# Patient Record
Sex: Female | Born: 1995 | Race: White | Hispanic: No | Marital: Single | Smoking: Former smoker
Health system: Southern US, Community
[De-identification: ages and names within clinical notes are randomized; demographics above are authoritative.]

## PROBLEM LIST (undated history)

## (undated) DIAGNOSIS — O24419 Gestational diabetes mellitus in pregnancy, unspecified control: Secondary | ICD-10-CM

## (undated) DIAGNOSIS — Z789 Other specified health status: Secondary | ICD-10-CM

## (undated) HISTORY — DX: Other specified health status: Z78.9

---
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
## 2014-08-11 ENCOUNTER — Emergency Department
Admission: EM | Admit: 2014-08-11 | Discharge: 2014-08-11 | Disposition: A | Discharge status: Home / Self Care | Payer: Self-pay | Attending: Emergency Medicine | Admitting: Emergency Medicine

## 2014-08-11 DIAGNOSIS — R350 Frequency of micturition: Secondary | ICD-10-CM | POA: Insufficient documentation

## 2014-08-11 DIAGNOSIS — Z3202 Encounter for pregnancy test, result negative: Secondary | ICD-10-CM | POA: Insufficient documentation

## 2014-08-11 DIAGNOSIS — J029 Acute pharyngitis, unspecified: Secondary | ICD-10-CM | POA: Insufficient documentation

## 2014-08-11 DIAGNOSIS — Z87898 Personal history of other specified conditions: Secondary | ICD-10-CM

## 2014-08-11 LAB — URINALYSIS COMPLETE WITH MICROSCOPIC (ARMC ONLY)
Bacteria, UA: NONE SEEN
Bilirubin Urine: NEGATIVE
Glucose, UA: NEGATIVE mg/dL
Hgb urine dipstick: NEGATIVE
Ketones, ur: NEGATIVE mg/dL
Nitrite: NEGATIVE
Protein, ur: NEGATIVE mg/dL
Specific Gravity, Urine: 1.024 (ref 1.005–1.030)
pH: 5 (ref 5.0–8.0)

## 2014-08-11 LAB — POCT PREGNANCY, URINE: Preg Test, Ur: NEGATIVE

## 2014-08-11 MED ORDER — AMOXICILLIN 875 MG PO TABS: 875.0000 mg | ORAL_TABLET | Freq: Two times a day (BID) | ORAL | Status: DC

## 2014-08-11 NOTE — ED Provider Notes (Signed)
[facility] Emergency Department Provider Note  ____________________________________________  Time seen: Approximately 4:54 PM  I have reviewed the triage vital signs and the nursing notes.   HISTORY  Chief Complaint Urinary Frequency; Possible Pregnancy; and Sore Throat   HPI Valerie Crane is a 19 y.o. female presents to ER for multiple medical complaints. Patient states last few days noticing she felt like she was urinating more often and states that she would occasionally have burning with urination only. Patient also reports that she does make sure she is not pregnant. Also complaints of sore throat 2 days.  States she has been drinking more water recently. States that she does occasionally have some burning with urination. Denies vaginal pain, burning, odor, discharge. Reports one sexual partner. Reports sometimes uses condoms. Patient reports that approximately 2-3 weeks ago she had sexual intercourse and states boyfriend "pulled out "but wanted to make sure she was not pregnant. Patient denies abdominal pain. Patient reports occasional nausea in the morning, but states that she has had nausea in the morning since she was a teenager and her nausea feels the same as her normal. Denies other changes. Reports last period 3 weeks ago. Denies abnormal or missed menstrual.   Patient also reports that she had a sore throat which onset was yesterday. Patient states that sore throat sometimes hurts to swallow. Also reports that she has had some runny nose and right ear discomfort. States sore throat pain is 2 out of 10 and scratchy. Denies other pain. Denies fever. Denies changes in eating or drinking. Denies bowel changes.     History reviewed. No pertinent past medical history.  There are no active problems to display for this patient.   History reviewed. No pertinent past surgical history.  No current outpatient prescriptions on file.  Allergies Review of  patient's allergies indicates no known allergies.  No family history on file.  Social History History  Substance Use Topics  . Smoking status: Never Smoker   . Smokeless tobacco: Not on file  . Alcohol Use: No    Review of Systems Constitutional: No fever/chills Eyes: No visual changes. ENT: No sore throat. Cardiovascular: Denies chest pain. Respiratory: Denies shortness of breath. Gastrointestinal: No abdominal pain.  No nausea, no vomiting.  No diarrhea.  No constipation. Genitourinary:reports occasional urinary burning and frequency. Denies urinary urgency, difficulty urinating. Musculoskeletal: Negative for back pain. Skin: Negative for rash. Neurological: Negative for headaches, focal weakness or numbness.  10-point ROS otherwise negative.  ____________________________________________   PHYSICAL EXAM:  VITAL SIGNS: ED Triage Vitals  Enc Vitals Group     BP 08/11/14 1538 131/94 mmHg     Pulse Rate 08/11/14 1538 96     Resp 08/11/14 1538 16     Temp 08/11/14 1538 98.4 F (36.9 C)     Temp src --      SpO2 08/11/14 1538 100 %     Weight 08/11/14 1538 210 lb (95.255 kg)     Height 08/11/14 1538 5\' 8"  (1.727 m)     Head Cir --      Peak Flow --      Pain Score --      Pain Loc --      Pain Edu? --      Excl. in GC? --     Constitutional: Alert and oriented. Well appearing and in no acute distress. Eyes: Conjunctivae are normal. PERRL. EOMI. Head: Atraumatic. Nose: mild clear rhinorrhea Ears: no erythema, normal TMs  Mouth/Throat: Mucous membranes are moist.  Pharynx mild erythema and mild bilateral swelling. No exudate. No uvular shift or deviation.  Neck: No stridor.  No cervical spine tenderness to palpation. Hematological/Lymphatic/Immunilogical: No cervical lymphadenopathy. Cardiovascular: Normal rate, regular rhythm. Grossly normal heart sounds.  Good peripheral circulation. Respiratory: Normal respiratory effort.  No retractions. Lungs  CTAB. Gastrointestinal: Soft and nontender. No distention. No abdominal bruits. No CVA tenderness.normal bowel sounds Musculoskeletal: No lower extremity tenderness nor edema.  No joint effusions. Neurologic:  Normal speech and language. No gross focal neurologic deficits are appreciated. Speech is normal. No gait instability. Skin:  Skin is warm, dry and intact. No rash noted. Psychiatric: Mood and affect are normal. Speech and behavior are normal.  ____________________________________________   LABS (all labs ordered are listed, but only abnormal results are displayed)  Labs Reviewed  URINALYSIS COMPLETEWITH MICROSCOPIC ([facility] ONLY) - Abnormal; Notable for the following:    Color, Urine YELLOW (*)    APPearance CLEAR (*)    Leukocytes, UA TRACE (*)    Squamous Epithelial / LPF 0-5 (*)    All other components within normal limits  URINE CULTURE  CULTURE, GROUP A STREP ([facility] ONLY)  POC URINE PREG, ED  POCT PREGNANCY, URINE   __ ____________________________________________   INITIAL IMPRESSION / ASSESSMENT AND PLAN / ED COURSE  Pertinent labs & imaging results that were available during my care of the patient were reviewed by me and considered in my medical decision making (see chart for details).  Very well-appearing patient. Presents to ER with complaints of multiple complaints. Patient states sore throat since yesterday also with some runny nose. Quick strep in ER negative, will culture. Continues to eat and drink well.  Reports intermittent urinary frequency but also states she's been drinking more water. Also reports occasional burning with urination. Denies vaginal complaints. Patient states she does not want a pelvic exam. Patient also expresses concern and wants to make sure she was not pregnant. Urine pregnancy in ER negative. Urine negative for bacteria, trace leukocytes. Will culture urine.Abdomen soft and nontender.   Discussed in length with patient regarding safe sex  practices as well as discussed follow-up with primary care physician or OB/GYN/health department for birth control and follow up. Patient states that she is considering to start birth control and states will follow-up this week. Patient states that she was just anxious and wanted to make sure she was not pregnant. Discussed the need for safe sex practices as well as to follow-up as potentially pregnant but not yet showing in urine. Patient verbalized understanding and agreed to follow-up.  ____________________________________________   FINAL CLINICAL IMPRESSION(S) / ED DIAGNOSES  Final diagnoses:  Pharyngitis  Hx of urinary frequency      Renford Dills, NP 08/11/14 1811  Minna Antis, MD 08/11/14 2230

## 2014-08-11 NOTE — Discharge Instructions (Signed)
Rest. Follow up with your primary care physician or the above this week. Practice safe sex always.   Return to ER for new or worsening concerns.   Pharyngitis Pharyngitis is redness, pain, and swelling (inflammation) of your pharynx.  CAUSES  Pharyngitis is usually caused by infection. Most of the time, these infections are from viruses (viral) and are part of a cold. However, sometimes pharyngitis is caused by bacteria (bacterial). Pharyngitis can also be caused by allergies. Viral pharyngitis may be spread from person to person by coughing, sneezing, and personal items or utensils (cups, forks, spoons, toothbrushes). Bacterial pharyngitis may be spread from person to person by more intimate contact, such as kissing.  SIGNS AND SYMPTOMS  Symptoms of pharyngitis include:   Sore throat.   Tiredness (fatigue).   Low-grade fever.   Headache.  Joint pain and muscle aches.  Skin rashes.  Swollen lymph nodes.  Plaque-like film on throat or tonsils (often seen with bacterial pharyngitis). DIAGNOSIS  Your health care provider will ask you questions about your illness and your symptoms. Your medical history, along with a physical exam, is often all that is needed to diagnose pharyngitis. Sometimes, a rapid strep test is done. Other lab tests may also be done, depending on the suspected cause.  TREATMENT  Viral pharyngitis will usually get better in 3-4 days without the use of medicine. Bacterial pharyngitis is treated with medicines that kill germs (antibiotics).  HOME CARE INSTRUCTIONS   Drink enough water and fluids to keep your urine clear or pale yellow.   Only take over-the-counter or prescription medicines as directed by your health care provider:   If you are prescribed antibiotics, make sure you finish them even if you start to feel better.   Do not take aspirin.   Get lots of rest.   Gargle with 8 oz of salt water ( tsp of salt per 1 qt of water) as often as every  1-2 hours to soothe your throat.   Throat lozenges (if you are not at risk for choking) or sprays may be used to soothe your throat. SEEK MEDICAL CARE IF:   You have large, tender lumps in your neck.  You have a rash.  You cough up green, yellow-brown, or bloody spit. SEEK IMMEDIATE MEDICAL CARE IF:   Your neck becomes stiff.  You drool or are unable to swallow liquids.  You vomit or are unable to keep medicines or liquids down.  You have severe pain that does not go away with the use of recommended medicines.  You have trouble breathing (not caused by a stuffy nose). MAKE SURE YOU:   Understand these instructions.  Will watch your condition.  Will get help right away if you are not doing well or get worse. Document Released: 01/22/2005 Document Revised: 11/12/2012 Document Reviewed: 09/29/2012 [facility] Patient Information 2015 [employer] [place] [place]. This information is not intended to replace advice given to you by your health care provider. Make sure you discuss any questions you have with your health care provider.

## 2014-08-11 NOTE — ED Notes (Signed)
Pt states that she has been having urinary frequency, burning with urination. Pt states that she has taken pregnancy test at home due to feeling nauseated in morning and it came out negative.  Pt also c/o sore throat that she noticed yesterday. Pt alert and oriented X4, active, cooperative, pt in NAD. RR even and unlabored, color WNL.

## 2014-08-12 LAB — POCT RAPID STREP A: STREPTOCOCCUS, GROUP A SCREEN (DIRECT): NEGATIVE

## 2014-08-12 LAB — CULTURE, GROUP A STREP (THRC)

## 2014-08-15 LAB — URINE CULTURE: Culture: 70000

## 2014-11-17 ENCOUNTER — Encounter (HOSPITAL_COMMUNITY): Payer: Self-pay | Admitting: *Deleted

## 2014-11-17 ENCOUNTER — Emergency Department (HOSPITAL_COMMUNITY)
Admission: EM | Admit: 2014-11-17 | Discharge: 2014-11-17 | Disposition: A | Discharge status: Home / Self Care | Payer: Self-pay | Attending: Emergency Medicine | Admitting: Emergency Medicine

## 2014-11-17 DIAGNOSIS — R040 Epistaxis: Secondary | ICD-10-CM | POA: Insufficient documentation

## 2014-11-17 DIAGNOSIS — Y9389 Activity, other specified: Secondary | ICD-10-CM | POA: Insufficient documentation

## 2014-11-17 DIAGNOSIS — Y92410 Unspecified street and highway as the place of occurrence of the external cause: Secondary | ICD-10-CM | POA: Insufficient documentation

## 2014-11-17 DIAGNOSIS — Y998 Other external cause status: Secondary | ICD-10-CM | POA: Insufficient documentation

## 2014-11-17 NOTE — ED Notes (Signed)
Nasal bleeding controlled.
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
## 2014-11-17 NOTE — Discharge Instructions (Signed)

## 2014-11-17 NOTE — ED Notes (Signed)
Declined W/C at D/C and was escorted to lobby by RN. 

## 2018-05-06 ENCOUNTER — Encounter: Payer: Self-pay | Admitting: Advanced Practice Midwife

## 2018-05-08 ENCOUNTER — Other Ambulatory Visit (HOSPITAL_COMMUNITY)
Admission: RE | Admit: 2018-05-08 | Discharge: 2018-05-08 | Disposition: A | Discharge status: Home / Self Care | Payer: Medicaid Other | Source: Ambulatory Visit | Attending: Advanced Practice Midwife | Admitting: Advanced Practice Midwife

## 2018-05-08 ENCOUNTER — Other Ambulatory Visit: Payer: Self-pay

## 2018-05-08 ENCOUNTER — Ambulatory Visit (INDEPENDENT_AMBULATORY_CARE_PROVIDER_SITE_OTHER): Payer: BC Managed Care – PPO | Admitting: Obstetrics & Gynecology

## 2018-05-08 DIAGNOSIS — Z3400 Encounter for supervision of normal first pregnancy, unspecified trimester: Secondary | ICD-10-CM

## 2018-05-08 DIAGNOSIS — Z3401 Encounter for supervision of normal first pregnancy, first trimester: Secondary | ICD-10-CM | POA: Diagnosis not present

## 2018-05-08 DIAGNOSIS — Z3A10 10 weeks gestation of pregnancy: Secondary | ICD-10-CM | POA: Diagnosis not present

## 2018-05-08 DIAGNOSIS — O0990 Supervision of high risk pregnancy, unspecified, unspecified trimester: Secondary | ICD-10-CM | POA: Insufficient documentation

## 2018-05-08 LAB — POCT URINALYSIS DIPSTICK
Glucose, UA: NEGATIVE
Ketones, UA: NEGATIVE
Nitrite, UA: NEGATIVE
Protein, UA: POSITIVE — AB
Spec Grav, UA: >=1.03 — AB (ref 1.010–1.025)
pH, UA: 5 (ref 5.0–8.0)

## 2018-05-08 NOTE — Progress Notes (Signed)
DATING AND VIABILITY SONOGRAM   Valerie Crane is a 23 y.o. year old G1P0 with LMP Patient's last menstrual period was 02/24/2018 (exact date). which would correlate to  [redacted]w[redacted]d weeks gestation.  She has regular menstrual cycles.   She is here today for a confirmatory initial sonogram.    GESTATION: SINGLETON     FETAL ACTIVITY:          Heart rate        162          The fetus is active.    ADNEXA: The ovaries are normal.   GESTATIONAL AGE AND  BIOMETRICS:  Gestational criteria: Estimated Date of Delivery: 12/01/18 by LMP now at [redacted]w[redacted]d  Previous Scans:0      CROWN RUMP LENGTH           3.38m     10-4  weeks                                                                               AVERAGE EGA(BY THIS SCAN):  10-4weeks  WORKING EDD( LMP ): 12/01/2018  TECHNICIAN COMMENTS:  Patient informed that the ultrasound is considered a limited obstetric ultrasound and is not intended to be a complete ultrasound exam. Patient also informed that the ultrasound is not being completed with the intent of assessing for fetal or placental anomalies or any pelvic abnormalities. Explained that the purpose of today's ultrasound is to assess for fetal heart rate. Patient acknowledges the purpose of the exam and the limitations of the study.   Armandina Stammer 05/08/2018 3:29 PM

## 2018-05-09 LAB — HEMOGLOBIN A1C
Est. average glucose Bld gHb Est-mCnc: 100 mg/dL
Hgb A1c MFr Bld: 5.1 % (ref 4.8–5.6)

## 2018-05-10 LAB — URINE CULTURE, OB REFLEX

## 2018-05-10 LAB — CULTURE, OB URINE

## 2018-05-12 ENCOUNTER — Telehealth: Payer: Self-pay

## 2018-05-12 ENCOUNTER — Other Ambulatory Visit: Payer: Self-pay | Admitting: Obstetrics & Gynecology

## 2018-05-12 ENCOUNTER — Encounter: Payer: Self-pay | Admitting: Obstetrics & Gynecology

## 2018-05-12 DIAGNOSIS — O99210 Obesity complicating pregnancy, unspecified trimester: Secondary | ICD-10-CM

## 2018-05-12 DIAGNOSIS — O2603 Excessive weight gain in pregnancy, third trimester: Secondary | ICD-10-CM | POA: Insufficient documentation

## 2018-05-12 DIAGNOSIS — A599 Trichomoniasis, unspecified: Secondary | ICD-10-CM

## 2018-05-12 LAB — CYTOLOGY - PAP
Chlamydia: NEGATIVE
Diagnosis: NEGATIVE
Neisseria Gonorrhea: NEGATIVE

## 2018-05-12 MED ORDER — METRONIDAZOLE 500 MG PO TABS: 2000.0000 mg | ORAL_TABLET | Freq: Once | ORAL | 0 refills | Status: AC

## 2018-05-12 NOTE — Progress Notes (Signed)
  Subjective:    Valerie Crane is being seen today for her first obstetrical visit.  This is not a planned pregnancy. She is at [redacted]w[redacted]d gestation. Her obstetrical history is significant for none. Relationship with FOB: significant other, not living together. Patient does intend to breast feed. Pregnancy history fully reviewed.  Patient reports no complaints.  Review of Systems:   Review of Systems  Objective:     BP 114/70   Pulse 79   Temp (!) 97.5 F (36.4 C)   Wt 223 lb 1.9 oz (101.2 kg)   LMP 02/24/2018 (Exact Date)   BMI 33.93 kg/m  Physical Exam  Exam    Assessment:    Pregnancy: G1P0 Patient Active Problem List   Diagnosis Date Noted  . Supervision of normal first pregnancy, antepartum 05/08/2018       Plan:     Initial labs drawn. Prenatal vitamins. Problem list reviewed and updated. AFP3 discussed: requested. Role of ultrasound in pregnancy discussed; fetal survey: requested. Amniocentesis discussed: not indicated. Follow up in 7 weeks. 60% of 45 min visit spent on counseling and coordination of care.  PNV  Willodean Rosenthal 05/12/2018

## 2018-05-12 NOTE — Telephone Encounter (Signed)
Called pt with pos Trich results. Medication was sent to pharmacy. Understanding was voiced. chiquita l wilson, CMA

## 2018-05-12 NOTE — Progress Notes (Signed)
Please call pt. She had trich. She needs meds called called to the pharmacy.  Flagyl 2 grams po x 1 No Rx listed.  Thx,    clh-S

## 2018-05-17 LAB — CYSTIC FIBROSIS GENE TEST

## 2018-05-17 LAB — OBSTETRIC PANEL, INCLUDING HIV
Antibody Screen: NEGATIVE
Basophils Absolute: 0.1 10*3/uL (ref 0.0–0.2)
Basos: 0 %
EOS (ABSOLUTE): 0.1 10*3/uL (ref 0.0–0.4)
Eos: 1 %
HIV Screen 4th Generation wRfx: NONREACTIVE
Hematocrit: 37 % (ref 34.0–46.6)
Hemoglobin: 13 g/dL (ref 11.1–15.9)
Hepatitis B Surface Ag: NEGATIVE
Immature Grans (Abs): 0.1 10*3/uL (ref 0.0–0.1)
Immature Granulocytes: 1 %
Lymphocytes Absolute: 2 10*3/uL (ref 0.7–3.1)
Lymphs: 13 %
MCH: 30.6 pg (ref 26.6–33.0)
MCHC: 35.1 g/dL (ref 31.5–35.7)
MCV: 87 fL (ref 79–97)
Monocytes Absolute: 0.5 10*3/uL (ref 0.1–0.9)
Monocytes: 3 %
Neutrophils Absolute: 13 10*3/uL — ABNORMAL HIGH (ref 1.4–7.0)
Neutrophils: 82 %
Platelets: 343 10*3/uL (ref 150–450)
RBC: 4.25 x10E6/uL (ref 3.77–5.28)
RDW: 12.9 % (ref 11.7–15.4)
RPR Ser Ql: NONREACTIVE
Rh Factor: POSITIVE
Rubella Antibodies, IGG: 1.34 index (ref >0.99)
WBC: 15.8 10*3/uL — ABNORMAL HIGH (ref 3.4–10.8)

## 2018-05-17 LAB — SMN1 COPY NUMBER ANALYSIS (SMA CARRIER SCREENING)

## 2018-05-26 ENCOUNTER — Telehealth: Payer: Self-pay | Admitting: Obstetrics & Gynecology

## 2018-05-26 NOTE — Telephone Encounter (Signed)
To to pt to review PNL. Attempted call to pt her mailbox was full. Let note in box to review at next prenatal visit.   clh-s

## 2018-06-05 ENCOUNTER — Encounter: Payer: Medicaid Other | Admitting: Obstetrics and Gynecology

## 2018-06-26 ENCOUNTER — Encounter: Payer: Self-pay | Admitting: Obstetrics & Gynecology

## 2018-06-26 ENCOUNTER — Other Ambulatory Visit (HOSPITAL_COMMUNITY)
Admission: RE | Admit: 2018-06-26 | Discharge: 2018-06-26 | Disposition: A | Discharge status: Home / Self Care | Payer: BC Managed Care – PPO | Source: Ambulatory Visit | Attending: Obstetrics and Gynecology | Admitting: Obstetrics and Gynecology

## 2018-06-26 ENCOUNTER — Other Ambulatory Visit: Payer: Self-pay

## 2018-06-26 ENCOUNTER — Ambulatory Visit (INDEPENDENT_AMBULATORY_CARE_PROVIDER_SITE_OTHER): Payer: BC Managed Care – PPO | Admitting: Obstetrics & Gynecology

## 2018-06-26 VITALS — BP 118/78 | HR 74 | Wt 243.0 lb

## 2018-06-26 DIAGNOSIS — Z3400 Encounter for supervision of normal first pregnancy, unspecified trimester: Secondary | ICD-10-CM

## 2018-06-26 DIAGNOSIS — Z3A17 17 weeks gestation of pregnancy: Secondary | ICD-10-CM

## 2018-06-26 DIAGNOSIS — A5901 Trichomonal vulvovaginitis: Secondary | ICD-10-CM | POA: Insufficient documentation

## 2018-06-26 DIAGNOSIS — O23591 Infection of other part of genital tract in pregnancy, first trimester: Secondary | ICD-10-CM | POA: Insufficient documentation

## 2018-06-26 DIAGNOSIS — O23592 Infection of other part of genital tract in pregnancy, second trimester: Secondary | ICD-10-CM

## 2018-06-26 NOTE — Patient Instructions (Addendum)
Thank you for enrolling in MyChart. Please follow the instructions below to securely access your online medical record. MyChart allows you to send messages to your doctor, view your test results, manage appointments, and more.   How Do I Sign Up? 1. In your Internet browser, go to [employer] and enter [email or web address]. 2. Click on the Sign Up Now link in the Sign In box. You will see the New Member Sign Up page. 3. Enter your MyChart Access Code exactly as it appears below. You will not need to use this code after you've completed the sign-up process. If you do not sign up before the expiration date, you must request a new code.  MyChart Access Code: [email or web address] Expires: 08/10/2018  1:40 PM  4. Enter your Social Security Number ([email or web address]) and Date of Birth (mm/dd/yyyy) as indicated and click Submit. You will be taken to the next sign-up page. 5. Create a MyChart ID. This will be your MyChart login ID and cannot be changed, so think of one that is secure and easy to remember. 6. Create a MyChart password. You can change your password at any time. 7. Enter your Password Reset Question and Answer. This can be used at a later time if you forget your password.  8. Enter your e-mail address. You will receive e-mail notification when new information is available in MyChart. 9. Click Sign Up. You can now view your medical record.   Additional Information Remember, MyChart is NOT to be used for urgent needs. For medical emergencies, dial 911.      Return to office for any scheduled appointments. Call the office or go to the MAU at [facility] at [facility] if:  You begin to have strong, frequent contractions  Your water breaks.  Sometimes it is a big gush of fluid, sometimes it is just a trickle that keeps getting your panties wet or running down your legs  You have vaginal bleeding.  It is normal to have a small amount of spotting if your cervix was  checked.   Any other obstetric concerns.    Second Trimester of Pregnancy The second trimester is from week 14 through week 27 (months 4 through 6). The second trimester is often a time when you feel your best. Your body has adjusted to being pregnant, and you begin to feel better physically. Usually, morning sickness has lessened or quit completely, you may have more energy, and you may have an increase in appetite. The second trimester is also a time when the fetus is growing rapidly. At the end of the sixth month, the fetus is about 9 inches long and weighs about 1 pounds. You will likely begin to feel the baby move (quickening) between 16 and 20 weeks of pregnancy. Body changes during your second trimester Your body continues to go through many changes during your second trimester. The changes vary from woman to woman.  Your weight will continue to increase. You will notice your lower abdomen bulging out.  You may begin to get stretch marks on your hips, abdomen, and breasts.  You may develop headaches that can be relieved by medicines. The medicines should be approved by your health care provider.  You may urinate more often because the fetus is pressing on your bladder.  You may develop or continue to have heartburn as a result of your pregnancy.  You may develop constipation because certain hormones are causing the muscles that push waste through your intestines to slow  down.  You may develop hemorrhoids or swollen, bulging veins (varicose veins).  You may have back pain. This is caused by: ? Weight gain. ? Pregnancy hormones that are relaxing the joints in your pelvis. ? A shift in weight and the muscles that support your balance.  Your breasts will continue to grow and they will continue to become tender.  Your gums may bleed and may be sensitive to brushing and flossing.  Dark spots or blotches (chloasma, mask of pregnancy) may develop on your face. This will likely fade  after the baby is born.  A dark line from your belly button to the pubic area (linea nigra) may appear. This will likely fade after the baby is born.  You may have changes in your hair. These can include thickening of your hair, rapid growth, and changes in texture. Some women also have hair loss during or after pregnancy, or hair that feels dry or thin. Your hair will most likely return to normal after your baby is born. What to expect at prenatal visits During a routine prenatal visit:  You will be weighed to make sure you and the fetus are growing normally.  Your blood pressure will be taken.  Your abdomen will be measured to track your baby's growth.  The fetal heartbeat will be listened to.  Any test results from the previous visit will be discussed. Your health care provider may ask you:  How you are feeling.  If you are feeling the baby move.  If you have had any abnormal symptoms, such as leaking fluid, bleeding, severe headaches, or abdominal cramping.  If you are using any tobacco products, including cigarettes, chewing tobacco, and electronic cigarettes.  If you have any questions. Other tests that may be performed during your second trimester include:  Blood tests that check for: ? Low iron levels (anemia). ? High blood sugar that affects pregnant women (gestational diabetes) between 1524 and 28 weeks. ? Rh antibodies. This is to check for a protein on red blood cells (Rh factor).  Urine tests to check for infections, diabetes, or protein in the urine.  An ultrasound to confirm the proper growth and development of the baby.  An amniocentesis to check for possible genetic problems.  Fetal screens for spina bifida and Down syndrome.  HIV (human immunodeficiency virus) testing. Routine prenatal testing includes screening for HIV, unless you choose not to have this test. Follow these instructions at home: Medicines  Follow your health care provider's instructions  regarding medicine use. Specific medicines may be either safe or unsafe to take during pregnancy.  Take a prenatal vitamin that contains at least 600 micrograms (mcg) of folic acid.  If you develop constipation, try taking a stool softener if your health care provider approves. Eating and drinking   Eat a balanced diet that includes fresh fruits and vegetables, whole grains, good sources of protein such as meat, eggs, or tofu, and low-fat dairy. Your health care provider will help you determine the amount of weight gain that is right for you.  Avoid raw meat and uncooked cheese. These carry germs that can cause birth defects in the baby.  If you have low calcium intake from food, talk to your health care provider about whether you should take a daily calcium supplement.  Limit foods that are high in fat and processed sugars, such as fried and sweet foods.  To prevent constipation: ? Drink enough fluid to keep your urine clear or pale yellow. ? Eat  foods that are high in fiber, such as fresh fruits and vegetables, whole grains, and beans. Activity  Exercise only as directed by your health care provider. Most women can continue their usual exercise routine during pregnancy. Try to exercise for 30 minutes at least 5 days a week. Stop exercising if you experience uterine contractions.  Avoid heavy lifting, wear low heel shoes, and practice good posture.  A sexual relationship may be continued unless your health care provider directs you otherwise. Relieving pain and discomfort  Wear a good support bra to prevent discomfort from breast tenderness.  Take warm sitz baths to soothe any pain or discomfort caused by hemorrhoids. Use hemorrhoid cream if your health care provider approves.  Rest with your legs elevated if you have leg cramps or low back pain.  If you develop varicose veins, wear support hose. Elevate your feet for 15 minutes, 3-4 times a day. Limit salt in your diet. Prenatal  Care  Write down your questions. Take them to your prenatal visits.  Keep all your prenatal visits as told by your health care provider. This is important. Safety  Wear your seat belt at all times when driving.  Make a list of emergency phone numbers, including numbers for family, friends, the hospital, and police and fire departments. General instructions  Ask your health care provider for a referral to a local prenatal education class. Begin classes no later than the beginning of month 6 of your pregnancy.  Ask for help if you have counseling or nutritional needs during pregnancy. Your health care provider can offer advice or refer you to specialists for help with various needs.  Do not use hot tubs, steam rooms, or saunas.  Do not douche or use tampons or scented sanitary pads.  Do not cross your legs for long periods of time.  Avoid cat litter boxes and soil used by cats. These carry germs that can cause birth defects in the baby and possibly loss of the fetus by miscarriage or stillbirth.  Avoid all smoking, herbs, alcohol, and unprescribed drugs. Chemicals in these products can affect the formation and growth of the baby.  Do not use any products that contain nicotine or tobacco, such as cigarettes and e-cigarettes. If you need help quitting, ask your health care provider.  Visit your dentist if you have not gone yet during your pregnancy. Use a soft toothbrush to brush your teeth and be gentle when you floss. Contact a health care provider if:  You have dizziness.  You have mild pelvic cramps, pelvic pressure, or nagging pain in the abdominal area.  You have persistent nausea, vomiting, or diarrhea.  You have a bad smelling vaginal discharge.  You have pain when you urinate. Get help right away if:  You have a fever.  You are leaking fluid from your vagina.  You have spotting or bleeding from your vagina.  You have severe abdominal cramping or pain.  You have  rapid weight gain or weight loss.  You have shortness of breath with chest pain.  You notice sudden or extreme swelling of your face, hands, ankles, feet, or legs.  You have not felt your baby move in over an hour.  You have severe headaches that do not go away when you take medicine.  You have vision changes. Summary  The second trimester is from week 14 through week 27 (months 4 through 6). It is also a time when the fetus is growing rapidly.  Your body goes through  many changes during pregnancy. The changes vary from woman to woman.  Avoid all smoking, herbs, alcohol, and unprescribed drugs. These chemicals affect the formation and growth your baby.  Do not use any tobacco products, such as cigarettes, chewing tobacco, and e-cigarettes. If you need help quitting, ask your health care provider.  Contact your health care provider if you have any questions. Keep all prenatal visits as told by your health care provider. This is important. This information is not intended to replace advice given to you by your health care provider. Make sure you discuss any questions you have with your health care provider. Document Released: 01/16/2001 Document Revised: 02/28/2016 Document Reviewed: 02/28/2016 Elsevier Interactive Patient Education  2019 [employer].

## 2018-06-26 NOTE — Progress Notes (Signed)
   PRENATAL VISIT NOTE  Subjective:  Valerie Crane is a 23 y.o. G1P0 at [redacted]w[redacted]d being seen today for ongoing prenatal care.  She is currently monitored for the following issues for this low-risk pregnancy and has Supervision of normal first pregnancy, antepartum; Maternal obesity, antepartum; and Trichomonal vaginitis during pregnancy in first trimester on their problem list.  Patient reports no complaints.  Contractions: Not present. Vag. Bleeding: None.  Movement: Present. Denies leaking of fluid.   The following portions of the patient's history were reviewed and updated as appropriate: allergies, current medications, past family history, past medical history, past social history, past surgical history and problem list.   Objective:   Vitals:   06/26/18 1330  BP: 118/78  Pulse: 74  Weight: 243 lb (110.2 kg)    Fetal Status: Fetal Heart Rate (bpm): 154   Movement: Present     General:  Alert, oriented and cooperative. Patient is in no acute distress.  Skin: Skin is warm and dry. No rash noted.   Cardiovascular: Normal heart rate noted  Respiratory: Normal respiratory effort, no problems with respiration noted  Abdomen: Soft, gravid, appropriate for gestational age.  Pain/Pressure: Absent     Pelvic: Cervical exam deferred        Extremities: Normal range of motion.  Edema: None  Mental Status: Normal mood and affect. Normal behavior. Normal judgment and thought content.   Assessment and Plan:  Pregnancy: G1P0 at [redacted]w[redacted]d 1. Trichomonal vaginitis during pregnancy in first trimester Test of cure done today. - Cervicovaginal ancillary only  2. Supervision of normal first pregnancy, antepartum Quad screen today. Already scheduled for anatomy scan. - AFP TETRA No other complaints or concerns.  Routine obstetric precautions reviewed. Please refer to After Visit Summary for other counseling recommendations.   Return in about 3 weeks (around 07/17/2018) for Virtual OB Visit.   Future Appointments  Date Time Provider Department Center  07/07/2018  1:15 PM [facility] [place] 4 [facility]    Jaynie Collins, MD

## 2018-06-27 LAB — CERVICOVAGINAL ANCILLARY ONLY
Bacterial vaginitis: NEGATIVE
Candida vaginitis: NEGATIVE
Chlamydia: NEGATIVE
Neisseria Gonorrhea: NEGATIVE
Trichomonas: POSITIVE — AB

## 2018-06-28 MED ORDER — METRONIDAZOLE 500 MG PO TABS: 500.0000 mg | ORAL_TABLET | Freq: Two times a day (BID) | ORAL | 0 refills | Status: DC

## 2018-06-28 NOTE — Addendum Note (Signed)
Addended by: Jaynie Collins A on: 06/28/2018 06:26 PM   Modules accepted: Orders

## 2018-07-01 LAB — AFP TETRA
DIA Mom Value: 0.54
DIA Value (EIA): 67.79 pg/mL
DSR (By Age)    1 IN: 1052
DSR (Second Trimester) 1 IN: 10000
Gestational Age: 17.4 WEEKS
MSAFP Mom: 1.18
MSAFP: 34.5 ng/mL
MSHCG Mom: 0.77
MSHCG: 17970 m[IU]/mL
Maternal Age At EDD: 24.5 yr
Osb Risk: 6916
T18 (By Age): 1:4098 {titer}
Test Results:: NEGATIVE
Weight: 243 [lb_av]
uE3 Mom: 0.78
uE3 Value: 0.83 ng/mL

## 2018-07-07 ENCOUNTER — Ambulatory Visit (HOSPITAL_COMMUNITY)
Admission: RE | Admit: 2018-07-07 | Discharge: 2018-07-07 | Disposition: A | Discharge status: Home / Self Care | Payer: BC Managed Care – PPO | Source: Ambulatory Visit | Attending: Obstetrics and Gynecology | Admitting: Obstetrics and Gynecology

## 2018-07-07 ENCOUNTER — Other Ambulatory Visit: Payer: Self-pay

## 2018-07-07 DIAGNOSIS — Z3400 Encounter for supervision of normal first pregnancy, unspecified trimester: Secondary | ICD-10-CM

## 2018-07-07 DIAGNOSIS — Z363 Encounter for antenatal screening for malformations: Secondary | ICD-10-CM | POA: Diagnosis not present

## 2018-07-07 DIAGNOSIS — Z3A19 19 weeks gestation of pregnancy: Secondary | ICD-10-CM

## 2018-07-08 ENCOUNTER — Other Ambulatory Visit (HOSPITAL_COMMUNITY): Payer: Self-pay | Admitting: *Deleted

## 2018-07-08 DIAGNOSIS — O99212 Obesity complicating pregnancy, second trimester: Secondary | ICD-10-CM

## 2018-07-23 ENCOUNTER — Encounter: Payer: BC Managed Care – PPO | Admitting: Advanced Practice Midwife

## 2018-07-23 ENCOUNTER — Telehealth: Payer: BC Managed Care – PPO | Admitting: Advanced Practice Midwife

## 2018-07-23 ENCOUNTER — Telehealth: Payer: Self-pay | Admitting: Radiology

## 2018-07-23 NOTE — Telephone Encounter (Signed)
Called patient to check in for mychart virtual visit, patient did not answer and mailbox was full so unable to leave a voicemail.

## 2018-08-06 ENCOUNTER — Other Ambulatory Visit: Payer: Self-pay

## 2018-08-06 ENCOUNTER — Ambulatory Visit (HOSPITAL_COMMUNITY)
Admission: RE | Admit: 2018-08-06 | Discharge: 2018-08-06 | Disposition: A | Discharge status: Home / Self Care | Payer: BC Managed Care – PPO | Source: Ambulatory Visit | Attending: Obstetrics and Gynecology | Admitting: Obstetrics and Gynecology

## 2018-08-06 ENCOUNTER — Ambulatory Visit (HOSPITAL_COMMUNITY): Payer: BC Managed Care – PPO | Admitting: *Deleted

## 2018-08-06 ENCOUNTER — Encounter (HOSPITAL_COMMUNITY): Payer: Self-pay

## 2018-08-06 DIAGNOSIS — Z362 Encounter for other antenatal screening follow-up: Secondary | ICD-10-CM | POA: Diagnosis not present

## 2018-08-06 DIAGNOSIS — O99210 Obesity complicating pregnancy, unspecified trimester: Secondary | ICD-10-CM | POA: Insufficient documentation

## 2018-08-06 DIAGNOSIS — O99212 Obesity complicating pregnancy, second trimester: Secondary | ICD-10-CM | POA: Diagnosis not present

## 2018-08-06 DIAGNOSIS — Z3400 Encounter for supervision of normal first pregnancy, unspecified trimester: Secondary | ICD-10-CM | POA: Insufficient documentation

## 2018-08-06 DIAGNOSIS — O359XX0 Maternal care for (suspected) fetal abnormality and damage, unspecified, not applicable or unspecified: Secondary | ICD-10-CM | POA: Diagnosis not present

## 2018-08-06 DIAGNOSIS — Z3A23 23 weeks gestation of pregnancy: Secondary | ICD-10-CM | POA: Diagnosis not present

## 2018-08-07 ENCOUNTER — Other Ambulatory Visit (HOSPITAL_COMMUNITY): Payer: Self-pay | Admitting: *Deleted

## 2018-08-07 DIAGNOSIS — O99210 Obesity complicating pregnancy, unspecified trimester: Secondary | ICD-10-CM

## 2018-09-03 ENCOUNTER — Encounter (HOSPITAL_COMMUNITY): Payer: Self-pay

## 2018-09-03 ENCOUNTER — Other Ambulatory Visit: Payer: Self-pay

## 2018-09-03 ENCOUNTER — Ambulatory Visit (HOSPITAL_COMMUNITY): Payer: BC Managed Care – PPO | Admitting: *Deleted

## 2018-09-03 ENCOUNTER — Ambulatory Visit (HOSPITAL_COMMUNITY)
Admission: RE | Admit: 2018-09-03 | Discharge: 2018-09-03 | Disposition: A | Discharge status: Home / Self Care | Payer: BC Managed Care – PPO | Source: Ambulatory Visit | Attending: Maternal & Fetal Medicine | Admitting: Maternal & Fetal Medicine

## 2018-09-03 DIAGNOSIS — O99210 Obesity complicating pregnancy, unspecified trimester: Secondary | ICD-10-CM | POA: Diagnosis not present

## 2018-09-03 DIAGNOSIS — O99212 Obesity complicating pregnancy, second trimester: Secondary | ICD-10-CM | POA: Diagnosis not present

## 2018-09-03 DIAGNOSIS — Z3A27 27 weeks gestation of pregnancy: Secondary | ICD-10-CM

## 2018-09-03 DIAGNOSIS — Z362 Encounter for other antenatal screening follow-up: Secondary | ICD-10-CM

## 2018-09-03 DIAGNOSIS — Z3400 Encounter for supervision of normal first pregnancy, unspecified trimester: Secondary | ICD-10-CM | POA: Diagnosis not present

## 2018-09-03 DIAGNOSIS — O359XX0 Maternal care for (suspected) fetal abnormality and damage, unspecified, not applicable or unspecified: Secondary | ICD-10-CM | POA: Diagnosis not present

## 2018-09-04 ENCOUNTER — Other Ambulatory Visit (HOSPITAL_COMMUNITY): Payer: Self-pay | Admitting: *Deleted

## 2018-09-04 DIAGNOSIS — O358XX0 Maternal care for other (suspected) fetal abnormality and damage, not applicable or unspecified: Secondary | ICD-10-CM

## 2018-09-15 ENCOUNTER — Telehealth: Payer: Self-pay

## 2018-09-15 ENCOUNTER — Telehealth: Payer: Self-pay | Admitting: Radiology

## 2018-09-15 NOTE — Telephone Encounter (Signed)
Babyscripts called stating patient had high BP reading. 126/91   Patient recorded this reading on Saturday 09/13/2018  Patient is a [place] patient. Will route to the [facility] [place] [facility]. Kathrene Alu RN

## 2018-09-15 NOTE — Telephone Encounter (Signed)
Left message for patient to call [facility] to schedule [place]

## 2018-09-18 ENCOUNTER — Ambulatory Visit (INDEPENDENT_AMBULATORY_CARE_PROVIDER_SITE_OTHER): Payer: BC Managed Care – PPO | Admitting: Obstetrics & Gynecology

## 2018-09-18 ENCOUNTER — Other Ambulatory Visit: Payer: Self-pay

## 2018-09-18 ENCOUNTER — Other Ambulatory Visit (HOSPITAL_COMMUNITY)
Admission: RE | Admit: 2018-09-18 | Discharge: 2018-09-18 | Disposition: A | Discharge status: Home / Self Care | Payer: BC Managed Care – PPO | Source: Ambulatory Visit | Attending: Obstetrics & Gynecology | Admitting: Obstetrics & Gynecology

## 2018-09-18 VITALS — BP 128/86 | HR 103 | Wt 268.0 lb

## 2018-09-18 DIAGNOSIS — A5901 Trichomonal vulvovaginitis: Secondary | ICD-10-CM | POA: Diagnosis not present

## 2018-09-18 DIAGNOSIS — Z23 Encounter for immunization: Secondary | ICD-10-CM | POA: Diagnosis not present

## 2018-09-18 DIAGNOSIS — O23591 Infection of other part of genital tract in pregnancy, first trimester: Secondary | ICD-10-CM | POA: Diagnosis not present

## 2018-09-18 DIAGNOSIS — Z3400 Encounter for supervision of normal first pregnancy, unspecified trimester: Secondary | ICD-10-CM

## 2018-09-18 DIAGNOSIS — O2603 Excessive weight gain in pregnancy, third trimester: Secondary | ICD-10-CM

## 2018-09-18 NOTE — Progress Notes (Signed)
   PRENATAL VISIT NOTE  Subjective:  Valerie Crane is a 23 y.o. G1P0 at [redacted]w[redacted]d being seen today for ongoing prenatal care.  She is currently monitored for the following issues for this low-risk pregnancy and has Supervision of normal first pregnancy, antepartum; Excessive weight gain during pregnancy in third trimester; and Trichomonal vaginitis during pregnancy in first trimester on their problem list.  Patient reports no complaints.  Contractions: Not present. Vag. Bleeding: None.  Movement: Present. Denies leaking of fluid.   The following portions of the patient's history were reviewed and updated as appropriate: allergies, current medications, past family history, past medical history, past social history, past surgical history and problem list.   Objective:   Vitals:   09/18/18 0859  BP: 128/86  Pulse: (!) 103  Weight: 268 lb (121.6 kg)    Fetal Status: Fetal Heart Rate (bpm): 134   Movement: Present     General:  Alert, oriented and cooperative. Patient is in no acute distress.  Skin: Skin is warm and dry. No rash noted.   Cardiovascular: Normal heart rate noted  Respiratory: Normal respiratory effort, no problems with respiration noted  Abdomen: Soft, gravid, appropriate for gestational age.  Pain/Pressure: Absent     Pelvic: Cervical exam deferred        Extremities: Normal range of motion.  Edema: None  Mental Status: Normal mood and affect. Normal behavior. Normal judgment and thought content.   Assessment and Plan:  Pregnancy: G1P0 at [redacted]w[redacted]d 1. Supervision of normal first pregnancy, antepartum  - HIV Antibody (routine testing w rflx) - RPR - CBC - Glucose Tolerance, 2 Hours w/1 Hour  2. Trichomonal vaginitis during pregnancy in first trimester - wet prep sent - Cervicovaginal ancillary only( [place])  3. Excessive weight gain during pregnancy in third trimester  - Referral to Nutrition and Diabetes Services - discussed risks including but not limited to  c/s, GDM, GHTN, stillbirth  Preterm labor symptoms and general obstetric precautions including but not limited to vaginal bleeding, contractions, leaking of fluid and fetal movement were reviewed in detail with the patient. Please refer to After Visit Summary for other counseling recommendations.   Return in about 3 weeks (around 10/09/2018) for virtual.  Future Appointments  Date Time Provider [place]  10/22/2018  1:45 PM [facility] [place] 2 [facility]    Emily Filbert, MD

## 2018-09-18 NOTE — Progress Notes (Signed)
Needs TOC

## 2018-09-18 NOTE — Addendum Note (Signed)
Addended by: Crosby Oyster on: 09/18/2018 09:24 AM   Modules accepted: Orders

## 2018-09-19 ENCOUNTER — Telehealth: Payer: Self-pay | Admitting: *Deleted

## 2018-09-19 DIAGNOSIS — O24419 Gestational diabetes mellitus in pregnancy, unspecified control: Secondary | ICD-10-CM

## 2018-09-19 LAB — CBC
Hematocrit: 36 % (ref 34.0–46.6)
Hemoglobin: 11.8 g/dL (ref 11.1–15.9)
MCH: 29.4 pg (ref 26.6–33.0)
MCHC: 32.8 g/dL (ref 31.5–35.7)
MCV: 90 fL (ref 79–97)
Platelets: 291 10*3/uL (ref 150–450)
RBC: 4.01 x10E6/uL (ref 3.77–5.28)
RDW: 12.5 % (ref 11.7–15.4)
WBC: 11.9 10*3/uL — ABNORMAL HIGH (ref 3.4–10.8)

## 2018-09-19 LAB — GLUCOSE TOLERANCE, 2 HOURS W/ 1HR
Glucose, 1 hour: 200 mg/dL — ABNORMAL HIGH (ref 65–179)
Glucose, 2 hour: 142 mg/dL (ref 65–152)
Glucose, Fasting: 97 mg/dL — ABNORMAL HIGH (ref 65–91)

## 2018-09-19 LAB — RPR: RPR Ser Ql: NONREACTIVE

## 2018-09-19 LAB — CERVICOVAGINAL ANCILLARY ONLY
Bacterial vaginitis: NEGATIVE
Trichomonas: NEGATIVE

## 2018-09-19 LAB — HIV ANTIBODY (ROUTINE TESTING W REFLEX): HIV Screen 4th Generation wRfx: NONREACTIVE

## 2018-09-19 MED ORDER — ACCU-CHEK FASTCLIX LANCETS MISC: 1.0000 [IU] | Freq: Four times a day (QID) | 12 refills | Status: DC

## 2018-09-19 MED ORDER — ACCU-CHEK GUIDE VI STRP: ORAL_STRIP | 12 refills | Status: DC

## 2018-09-19 MED ORDER — ACCU-CHEK GUIDE W/DEVICE KIT: 1.0000 | PACK | Freq: Four times a day (QID) | 0 refills | Status: DC

## 2018-09-19 NOTE — Telephone Encounter (Signed)
Pt informed of GDM results and supplies being sent into pharmacy. Discussed checking blood sugars, fasting and 2hr PP. Also informed about putting blood sugars into babyscripts app and referral to diabetes and nutrition. Pt verbalizes and understands

## 2018-09-24 ENCOUNTER — Encounter: Payer: Self-pay | Admitting: Obstetrics & Gynecology

## 2018-09-24 DIAGNOSIS — O24410 Gestational diabetes mellitus in pregnancy, diet controlled: Secondary | ICD-10-CM | POA: Insufficient documentation

## 2018-09-25 ENCOUNTER — Telehealth: Payer: Self-pay | Admitting: *Deleted

## 2018-09-25 NOTE — Telephone Encounter (Signed)
Pt informed of cervicoancillary results.

## 2018-10-01 ENCOUNTER — Telehealth: Payer: Self-pay | Admitting: Family Medicine

## 2018-10-01 NOTE — Telephone Encounter (Signed)
Attempted to call patient about her appointment on 8/27 @ 9:15. No answer, left voicemail instructing patient to wear a face mask for the entire appointment and no visitors are allowed during the visit. Patient instructed not to attend the appointment if she was any symptoms. Symptom list and office number left.

## 2018-10-02 ENCOUNTER — Other Ambulatory Visit: Payer: BC Managed Care – PPO

## 2018-10-09 ENCOUNTER — Ambulatory Visit (INDEPENDENT_AMBULATORY_CARE_PROVIDER_SITE_OTHER): Payer: BC Managed Care – PPO | Admitting: Obstetrics and Gynecology

## 2018-10-09 ENCOUNTER — Other Ambulatory Visit: Payer: Self-pay

## 2018-10-09 VITALS — BP 138/84 | HR 118 | Wt 265.4 lb

## 2018-10-09 DIAGNOSIS — A5901 Trichomonal vulvovaginitis: Secondary | ICD-10-CM

## 2018-10-09 DIAGNOSIS — O0993 Supervision of high risk pregnancy, unspecified, third trimester: Secondary | ICD-10-CM

## 2018-10-09 DIAGNOSIS — Z91199 Patient's noncompliance with other medical treatment and regimen due to unspecified reason: Secondary | ICD-10-CM

## 2018-10-09 DIAGNOSIS — E669 Obesity, unspecified: Secondary | ICD-10-CM

## 2018-10-09 DIAGNOSIS — O09893 Supervision of other high risk pregnancies, third trimester: Secondary | ICD-10-CM

## 2018-10-09 DIAGNOSIS — O99210 Obesity complicating pregnancy, unspecified trimester: Secondary | ICD-10-CM

## 2018-10-09 DIAGNOSIS — O24419 Gestational diabetes mellitus in pregnancy, unspecified control: Secondary | ICD-10-CM

## 2018-10-09 DIAGNOSIS — O3660X0 Maternal care for excessive fetal growth, unspecified trimester, not applicable or unspecified: Secondary | ICD-10-CM

## 2018-10-09 DIAGNOSIS — Z3A32 32 weeks gestation of pregnancy: Secondary | ICD-10-CM

## 2018-10-09 DIAGNOSIS — R030 Elevated blood-pressure reading, without diagnosis of hypertension: Secondary | ICD-10-CM

## 2018-10-09 DIAGNOSIS — O23591 Infection of other part of genital tract in pregnancy, first trimester: Secondary | ICD-10-CM

## 2018-10-09 NOTE — Progress Notes (Signed)
Prenatal Visit Note Date: 10/09/2018 Clinic: Center for [facility]  Subjective:  Valerie Crane is a 23 y.o. G1P0 at [redacted]w[redacted]d being seen today for ongoing prenatal care.  She is currently monitored for the following issues for this high-risk pregnancy and has Supervision of normal first pregnancy, antepartum; Excessive weight gain during pregnancy in third trimester; Trichomonal vaginitis during pregnancy in first trimester; GDM (gestational diabetes mellitus); BMI 30s; and Obesity in pregnancy on their problem list.  Patient reports no complaints.   Contractions: Not present. Vag. Bleeding: None.  Movement: Present. Denies leaking of fluid.   The following portions of the patient's history were reviewed and updated as appropriate: allergies, current medications, past family history, past medical history, past social history, past surgical history and problem list. Problem list updated.  Objective:   Vitals:   10/09/18 1420 10/09/18 1423  BP: (!) 142/91 138/84  Pulse: (!) 116 (!) 118  Weight: 265 lb 6.4 oz (120.4 kg)     Fetal Status: Fetal Heart Rate (bpm): 143   Movement: Present     General:  Alert, oriented and cooperative. Patient is in no acute distress.  Skin: Skin is warm and dry. No rash noted.   Cardiovascular: Normal heart rate noted  Respiratory: Normal respiratory effort, no problems with respiration noted  Abdomen: Soft, gravid, appropriate for gestational age. Pain/Pressure: Absent     Pelvic:  Cervical exam deferred        Extremities: Normal range of motion.  Edema: None  Mental Status: Normal mood and affect. Normal behavior. Normal judgment and thought content.   Urinalysis:      Assessment and Plan:  Pregnancy: G1P0 at [redacted]w[redacted]d  1. Trichomonal vaginitis during pregnancy in first trimester toc neg  2. Gestational diabetes mellitus (GDM), antepartum, gestational diabetes method of control unspecified CBG 158. She states she only had a non  sugar powerade about an hour and no lunch or breakfast. Poor compliance. Stressed to patient importance of checking CBGs in order for glycemic control; I told her her fetus is already LGA, also indicating poor control. Risks of fetal stress, IUFD d/w her. I recommended antepartum admission today. Pt declines this and promises will check her CBGs qid. rNST today (135 baseline, +accels, no decel, mod variability, toco quiet x 78min).  Patient to come to hospital if CBGs are consistently elevated above parameters given her. Has rpt growth on 9/16.  - Hemoglobin A1c  3. Supervision of high risk pregnancy in third trimester See above.  - Hemoglobin A1c - Comprehensive metabolic panel - Protein / creatinine ratio, urine  4. BMI 30s  5. Obesity in pregnancy  6. Excessive fetal growth affecting management of pregnancy, antepartum, single or unspecified fetus See above  7. Transient hypertension No s/s of pre-eclampsia. ED precautions given. Will get labs today.  - Comprehensive metabolic panel - Protein / creatinine ratio, urine  8. Noncompliant pregnant patient in third trimester  Preterm labor symptoms and general obstetric precautions including but not limited to vaginal bleeding, contractions, leaking of fluid and fetal movement were reviewed in detail with the patient. Please refer to After Visit Summary for other counseling recommendations.  Return in about 5 days (around 10/14/2018) for in person hrob visit.   Aletha Halim, MD

## 2018-10-09 NOTE — Patient Instructions (Signed)
Go to the hospital if you symptoms of pre-eclampsia, or if your blood pressures are consistently above 140 for the top number or above 90 for the bottom number. Also go to the hospital if your morning fasting blood sugar is consistently in the 120s or if your two hour after meal visits are consistently above 150.

## 2018-10-09 NOTE — Progress Notes (Signed)
Patient concerned about elevated BP at home.

## 2018-10-10 LAB — COMPREHENSIVE METABOLIC PANEL
ALT: 8 IU/L (ref 0–32)
AST: 11 IU/L (ref 0–40)
Albumin/Globulin Ratio: 1.4 (ref 1.2–2.2)
Albumin: 3.8 g/dL — ABNORMAL LOW (ref 3.9–5.0)
Alkaline Phosphatase: 135 IU/L — ABNORMAL HIGH (ref 39–117)
BUN/Creatinine Ratio: 15 (ref 9–23)
BUN: 9 mg/dL (ref 6–20)
Bilirubin Total: <0.2 mg/dL (ref 0.0–1.2)
CO2: 18 mmol/L — ABNORMAL LOW (ref 20–29)
Calcium: 9.6 mg/dL (ref 8.7–10.2)
Chloride: 101 mmol/L (ref 96–106)
Creatinine, Ser: 0.61 mg/dL (ref 0.57–1.00)
GFR calc Af Amer: 147 mL/min/{1.73_m2} (ref >59)
GFR calc non Af Amer: 127 mL/min/{1.73_m2} (ref >59)
Globulin, Total: 2.7 g/dL (ref 1.5–4.5)
Glucose: 145 mg/dL — ABNORMAL HIGH (ref 65–99)
Potassium: 4 mmol/L (ref 3.5–5.2)
Sodium: 137 mmol/L (ref 134–144)
Total Protein: 6.5 g/dL (ref 6.0–8.5)

## 2018-10-10 LAB — PROTEIN / CREATININE RATIO, URINE
Creatinine, Urine: 125.6 mg/dL
Protein, Ur: 13.4 mg/dL
Protein/Creat Ratio: 107 mg/g creat (ref 0–200)

## 2018-10-10 LAB — HEMOGLOBIN A1C
Est. average glucose Bld gHb Est-mCnc: 111 mg/dL
Hgb A1c MFr Bld: 5.5 % (ref 4.8–5.6)

## 2018-10-13 ENCOUNTER — Encounter: Payer: Self-pay | Admitting: Family Medicine

## 2018-10-14 ENCOUNTER — Other Ambulatory Visit: Payer: Self-pay

## 2018-10-14 ENCOUNTER — Encounter: Payer: Self-pay | Admitting: Obstetrics & Gynecology

## 2018-10-14 ENCOUNTER — Ambulatory Visit (INDEPENDENT_AMBULATORY_CARE_PROVIDER_SITE_OTHER): Payer: BC Managed Care – PPO | Admitting: Obstetrics & Gynecology

## 2018-10-14 VITALS — BP 131/87 | HR 97 | Wt 279.0 lb

## 2018-10-14 DIAGNOSIS — O133 Gestational [pregnancy-induced] hypertension without significant proteinuria, third trimester: Secondary | ICD-10-CM

## 2018-10-14 DIAGNOSIS — O24419 Gestational diabetes mellitus in pregnancy, unspecified control: Secondary | ICD-10-CM

## 2018-10-14 DIAGNOSIS — Z3A33 33 weeks gestation of pregnancy: Secondary | ICD-10-CM

## 2018-10-14 DIAGNOSIS — O0993 Supervision of high risk pregnancy, unspecified, third trimester: Secondary | ICD-10-CM

## 2018-10-14 NOTE — Progress Notes (Signed)
PRENATAL VISIT NOTE  Subjective:  Valerie Crane is a 23 y.o. G1P0 at [redacted]w[redacted]d being seen today for ongoing prenatal care.  She is currently monitored for the following issues for this high-risk pregnancy and has Supervision of high-risk pregnancy; Excessive weight gain during pregnancy in third trimester; Trichomonal vaginitis during pregnancy in first trimester; GDM (gestational diabetes mellitus); BMI 30s; Obesity in pregnancy; and Gestational hypertension, third trimester on their problem list.  Patient reports continued swelling in hands and feet. Patient denies any headaches, visual symptoms, RUQ/epigastric pain or other concerning symptoms.  Contractions: Not present. Vag. Bleeding: None.  Movement: Present. Denies leaking of fluid.   The following portions of the patient's history were reviewed and updated as appropriate: allergies, current medications, past family history, past medical history, past social history, past surgical history and problem list.   Objective:   Vitals:   10/14/18 0905  BP: 131/87  Pulse: 97  Weight: 279 lb (126.6 kg)    Fetal Status: Fetal Heart Rate (bpm): 127   Movement: Present     General:  Alert, oriented and cooperative. Patient is in no acute distress.  Skin: Skin is warm and dry. No rash noted.   Cardiovascular: Normal heart rate noted  Respiratory: Normal respiratory effort, no problems with respiration noted  Abdomen: Soft, gravid, appropriate for gestational age.  Pain/Pressure: Absent     Pelvic: Cervical exam deferred        Extremities: Normal range of motion.  Edema: Mild pitting, slight indentation  Mental Status: Normal mood and affect. Normal behavior. Normal judgment and thought content.   Results for orders placed or performed in visit on 10/09/18 (from the past 336 hour(s))  Hemoglobin A1c   Collection Time: 10/09/18  2:50 PM  Result Value Ref Range   Hgb A1c MFr Bld 5.5 4.8 - 5.6 %   Est. average glucose Bld gHb Est-mCnc 111  mg/dL  Comprehensive metabolic panel   Collection Time: 10/09/18  2:50 PM  Result Value Ref Range   Glucose 145 (H) 65 - 99 mg/dL   BUN 9 6 - 20 mg/dL   Creatinine, Ser 0.61 0.57 - 1.00 mg/dL   GFR calc non Af Amer 127 >59 mL/min/1.73   GFR calc Af Amer 147 >59 mL/min/1.73   BUN/Creatinine Ratio 15 9 - 23   Sodium 137 134 - 144 mmol/L   Potassium 4.0 3.5 - 5.2 mmol/L   Chloride 101 96 - 106 mmol/L   CO2 18 (L) 20 - 29 mmol/L   Calcium 9.6 8.7 - 10.2 mg/dL   Total Protein 6.5 6.0 - 8.5 g/dL   Albumin 3.8 (L) 3.9 - 5.0 g/dL   Globulin, Total 2.7 1.5 - 4.5 g/dL   Albumin/Globulin Ratio 1.4 1.2 - 2.2   Bilirubin Total <0.2 0.0 - 1.2 mg/dL   Alkaline Phosphatase 135 (H) 39 - 117 IU/L   AST 11 0 - 40 IU/L   ALT 8 0 - 32 IU/L  Protein / creatinine ratio, urine   Collection Time: 10/09/18  2:50 PM  Result Value Ref Range   Creatinine, Urine 125.6 Not Estab. mg/dL   Protein, Ur 13.4 Not Estab. mg/dL   Protein/Creat Ratio 107 0 - 200 mg/g creat     Assessment and Plan:  Pregnancy: G1P0 at [redacted]w[redacted]d 1. Gestational hypertension, third trimester On review of BP, she had DBP of 91 on 09/13/18 (30w) and BP of 142/91 on 9/3 (32w). She meets criteria for GHTN. Normal labs recently. No  severe features.  Needs antenatal testing weekly (scheduled BPP on 10/15/18), already scheduled for growth scan on 10/22/18 (added on BPP). PEC precautions reviewed with patient. Will continue to monitor closely.  - US MFM FETAL BPP WO NON STRESS; Future - US MFM FETAL BPP WO NON STRESS; Future  2. Gestational diabetes mellitus (GDM), antepartum, gestational diabetes method of control unspecified Stable CBGs but few values, advised to check as instructed. Continue diet control.   3. Supervision of high risk pregnancy in third trimester Preterm labor symptoms and general obstetric precautions including but not limited to vaginal bleeding, contractions, leaking of fluid and fetal movement were reviewed in detail with the  patient. Please refer to After Visit Summary for other counseling recommendations.   Return in about 2 weeks (around 10/28/2018) for Virtual OB Visit     3 weeks from now: OFFICE OB Visit, Pelvic cultures, NST.  Future Appointments  Date Time Provider Department Center  10/15/2018  1:45 PM [facility] US 2 [facility]  10/16/2018  8:15 AM [facility]ED[facility]ATION [facility]  10/22/2018  1:45 PM [facility] US 2 [facility]  10/28/2018 10:45 AM Jalon Blackwelder, Jethro BastosUgonna A, MD [facility]  11/06/2018  1:45 PM Jesup BingPickens, Charlie, MD [facility]    Jaynie CollinsUgonna Mikle Sternberg, MD

## 2018-10-14 NOTE — Patient Instructions (Signed)
Return to office for any scheduled appointments. Call the office or go to the MAU at [facility] [place] if:  You begin to have strong, frequent contractions  Your water breaks.  Sometimes it is a big gush of fluid, sometimes it is just a trickle that keeps getting your panties wet or running down your legs  You have vaginal bleeding.  It is normal to have a small amount of spotting if your cervix was checked.   You do not feel your baby moving like normal.  If you do not, get something to eat and drink and lay down and focus on feeling your baby move.   If your baby is still not moving like normal, you should call the office or go to MAU.  Any other obstetric concerns.   

## 2018-10-15 ENCOUNTER — Encounter: Payer: Self-pay | Admitting: Obstetrics & Gynecology

## 2018-10-15 ENCOUNTER — Encounter (HOSPITAL_COMMUNITY): Payer: Self-pay

## 2018-10-15 ENCOUNTER — Ambulatory Visit (HOSPITAL_COMMUNITY)
Admission: RE | Admit: 2018-10-15 | Discharge: 2018-10-15 | Disposition: A | Discharge status: Home / Self Care | Payer: BC Managed Care – PPO | Source: Ambulatory Visit | Attending: Obstetrics & Gynecology | Admitting: Obstetrics & Gynecology

## 2018-10-15 ENCOUNTER — Other Ambulatory Visit: Payer: Self-pay

## 2018-10-15 ENCOUNTER — Ambulatory Visit (HOSPITAL_COMMUNITY): Payer: BC Managed Care – PPO | Admitting: *Deleted

## 2018-10-15 DIAGNOSIS — O133 Gestational [pregnancy-induced] hypertension without significant proteinuria, third trimester: Secondary | ICD-10-CM | POA: Insufficient documentation

## 2018-10-15 DIAGNOSIS — O0993 Supervision of high risk pregnancy, unspecified, third trimester: Secondary | ICD-10-CM | POA: Diagnosis not present

## 2018-10-15 DIAGNOSIS — O358XX0 Maternal care for other (suspected) fetal abnormality and damage, not applicable or unspecified: Secondary | ICD-10-CM | POA: Insufficient documentation

## 2018-10-15 DIAGNOSIS — O2603 Excessive weight gain in pregnancy, third trimester: Secondary | ICD-10-CM

## 2018-10-15 DIAGNOSIS — Z3A33 33 weeks gestation of pregnancy: Secondary | ICD-10-CM | POA: Diagnosis not present

## 2018-10-15 DIAGNOSIS — O24419 Gestational diabetes mellitus in pregnancy, unspecified control: Secondary | ICD-10-CM | POA: Diagnosis not present

## 2018-10-15 DIAGNOSIS — O359XX0 Maternal care for (suspected) fetal abnormality and damage, unspecified, not applicable or unspecified: Secondary | ICD-10-CM | POA: Diagnosis not present

## 2018-10-15 DIAGNOSIS — O3663X0 Maternal care for excessive fetal growth, third trimester, not applicable or unspecified: Secondary | ICD-10-CM | POA: Insufficient documentation

## 2018-10-15 DIAGNOSIS — O99213 Obesity complicating pregnancy, third trimester: Secondary | ICD-10-CM

## 2018-10-15 DIAGNOSIS — Z362 Encounter for other antenatal screening follow-up: Secondary | ICD-10-CM | POA: Diagnosis not present

## 2018-10-15 DIAGNOSIS — O24410 Gestational diabetes mellitus in pregnancy, diet controlled: Secondary | ICD-10-CM

## 2018-10-16 ENCOUNTER — Telehealth: Payer: Self-pay | Admitting: Family Medicine

## 2018-10-16 ENCOUNTER — Other Ambulatory Visit: Payer: BC Managed Care – PPO

## 2018-10-16 NOTE — Telephone Encounter (Signed)
Attempted to reach patient about her appointment. Was not able to leave a voicemail message. °

## 2018-10-22 ENCOUNTER — Ambulatory Visit (HOSPITAL_COMMUNITY): Payer: BC Managed Care – PPO | Admitting: *Deleted

## 2018-10-22 ENCOUNTER — Encounter (HOSPITAL_COMMUNITY): Payer: Self-pay

## 2018-10-22 ENCOUNTER — Other Ambulatory Visit: Payer: Self-pay

## 2018-10-22 ENCOUNTER — Ambulatory Visit (HOSPITAL_COMMUNITY)
Admission: RE | Admit: 2018-10-22 | Discharge: 2018-10-22 | Disposition: A | Discharge status: Home / Self Care | Payer: BC Managed Care – PPO | Source: Ambulatory Visit | Attending: Obstetrics and Gynecology | Admitting: Obstetrics and Gynecology

## 2018-10-22 ENCOUNTER — Other Ambulatory Visit (HOSPITAL_COMMUNITY): Payer: Self-pay | Admitting: *Deleted

## 2018-10-22 DIAGNOSIS — O24410 Gestational diabetes mellitus in pregnancy, diet controlled: Secondary | ICD-10-CM | POA: Diagnosis not present

## 2018-10-22 DIAGNOSIS — O359XX0 Maternal care for (suspected) fetal abnormality and damage, unspecified, not applicable or unspecified: Secondary | ICD-10-CM | POA: Diagnosis not present

## 2018-10-22 DIAGNOSIS — O2603 Excessive weight gain in pregnancy, third trimester: Secondary | ICD-10-CM

## 2018-10-22 DIAGNOSIS — O0993 Supervision of high risk pregnancy, unspecified, third trimester: Secondary | ICD-10-CM

## 2018-10-22 DIAGNOSIS — O24419 Gestational diabetes mellitus in pregnancy, unspecified control: Secondary | ICD-10-CM | POA: Insufficient documentation

## 2018-10-22 DIAGNOSIS — O99213 Obesity complicating pregnancy, third trimester: Secondary | ICD-10-CM

## 2018-10-22 DIAGNOSIS — O133 Gestational [pregnancy-induced] hypertension without significant proteinuria, third trimester: Secondary | ICD-10-CM | POA: Insufficient documentation

## 2018-10-22 DIAGNOSIS — Z3A34 34 weeks gestation of pregnancy: Secondary | ICD-10-CM

## 2018-10-27 ENCOUNTER — Encounter: Payer: Self-pay | Admitting: Family Medicine

## 2018-10-28 ENCOUNTER — Encounter: Payer: Self-pay | Admitting: Obstetrics & Gynecology

## 2018-10-28 ENCOUNTER — Other Ambulatory Visit: Payer: Self-pay

## 2018-10-28 ENCOUNTER — Telehealth (HOSPITAL_COMMUNITY): Payer: Self-pay | Admitting: *Deleted

## 2018-10-28 ENCOUNTER — Encounter (HOSPITAL_COMMUNITY): Payer: Self-pay | Admitting: *Deleted

## 2018-10-28 ENCOUNTER — Telehealth (INDEPENDENT_AMBULATORY_CARE_PROVIDER_SITE_OTHER): Payer: BC Managed Care – PPO | Admitting: Obstetrics & Gynecology

## 2018-10-28 VITALS — BP 149/104

## 2018-10-28 DIAGNOSIS — O2603 Excessive weight gain in pregnancy, third trimester: Secondary | ICD-10-CM

## 2018-10-28 DIAGNOSIS — O133 Gestational [pregnancy-induced] hypertension without significant proteinuria, third trimester: Secondary | ICD-10-CM

## 2018-10-28 DIAGNOSIS — O0993 Supervision of high risk pregnancy, unspecified, third trimester: Secondary | ICD-10-CM

## 2018-10-28 DIAGNOSIS — O24410 Gestational diabetes mellitus in pregnancy, diet controlled: Secondary | ICD-10-CM

## 2018-10-28 DIAGNOSIS — Z3A35 35 weeks gestation of pregnancy: Secondary | ICD-10-CM

## 2018-10-28 NOTE — Progress Notes (Signed)
TELEHEALTH OBSTETRICS PRENATAL VIRTUAL VIDEO VISIT ENCOUNTER NOTE  Provider location: Center for [facility]   I connected with Valerie Crane on 10/28/18 at 10:45 AM EDT by MyChart Video Encounter at home and verified that I am speaking with the correct person using two identifiers.   I discussed the limitations, risks, security and privacy concerns of performing an evaluation and management service virtually and the availability of in person appointments. I also discussed with the patient that there may be a patient responsible charge related to this service. The patient expressed understanding and agreed to proceed. Subjective:  Valerie Crane is a 23 y.o. G1P0 at [redacted]w[redacted]d being seen today for ongoing prenatal care.  She is currently monitored for the following issues for this high-risk pregnancy and has Supervision of high-risk pregnancy; Excessive weight gain during pregnancy in third trimester; Trichomonal vaginitis during pregnancy in first trimester; GDM (gestational diabetes mellitus); BMI 30s; Obesity in pregnancy; Gestational hypertension, third trimester; and LGA (large for gestational age) fetus affecting management of mother, third trimester on their problem list.  Patient reports increased swelling in hands and feet.  Patient denies any headaches, visual symptoms, RUQ/epigastric pain or other concerning symptoms. No bleeding, no contractions. Movement: Present. Denies any leaking of fluid.   The following portions of the patient's history were reviewed and updated as appropriate: allergies, current medications, past family history, past medical history, past social history, past surgical history and problem list.   Objective:   Vitals:   10/28/18 1056 10/28/18 1057  BP: (!) 152/98 (!) 149/104  Blood sugars this am 86 BP 152/98, recheck 149/104  Fetal Status:     Movement: Present     General:  Alert, oriented and cooperative. Patient is in no acute  distress.  Respiratory: Normal respiratory effort, no problems with respiration noted  Mental Status: Normal mood and affect. Normal behavior. Normal judgment and thought content.  Rest of physical exam deferred due to type of encounter  Imaging: [place] Mfm Fetal Bpp Wo Non Stress  Result Date: 10/22/2018 ----------------------------------------------------------------------  OBSTETRICS REPORT                       (Signed Final 10/22/2018 09:52 pm) ---------------------------------------------------------------------- Patient Info  ID #:       [id number]                          D.O.B.:  Apr 22, 1995 (23 yrs)  Name:       Valerie Crane [facility]                Visit Date: 10/22/2018 02:21 pm ---------------------------------------------------------------------- Performed By  Performed By:     Percell Boston          Ref. Address:      [address] [place],  [place] [address]  Attending:        Noralee Space MD        [facility]  Referred By:      Willodean Rosenthal MD ---------------------------------------------------------------------- Orders   #  Description                          Code         Ordered By   1  [place] MFM FETAL BPP WO NON              60454.09     Jaynie Collins      STRESS  ----------------------------------------------------------------------   #  Order #                    Accession #                 Episode #   1  [id number]                  [id number]                  [id number]  ---------------------------------------------------------------------- Indications   [redacted] weeks gestation of pregnancy                Z3A.34   Gestational hypertension without significant   O13.3   proteinuria, third trimester   Gestational  diabetes in pregnancy, diet        O24.410   controlled   Obesity complicating pregnancy, third          O99.213   trimester   Fetal abnormality - other known or             O35.9XX0   suspected (PRUV)  ---------------------------------------------------------------------- Vital Signs                                                 Height:        5'8" ---------------------------------------------------------------------- Fetal Evaluation  Num Of Fetuses:          1  Fetal Heart Rate(bpm):   155  Cardiac Activity:        Observed  Presentation:            Cephalic  Amniotic Fluid  AFI FV:      Within normal limits  AFI Sum(cm)     %Tile       Largest Pocket(cm)  16.92           62          4.72  RUQ(cm)       RLQ(cm)       LUQ(cm)  LLQ(cm)  4.35          4.72          4.04           3.81 ---------------------------------------------------------------------- Biophysical Evaluation  Amniotic F.V:   Within normal limits       F. Tone:         Observed  F. Movement:    Observed                   Score:           8/8  F. Breathing:   Observed ---------------------------------------------------------------------- OB History  Gravidity:    1         Term:   0  Living:       0 ---------------------------------------------------------------------- Gestational Age  Best:          34w 3d     Det. By:  Previous Ultrasound      EDD:   11/30/18                                      (05/08/18) ---------------------------------------------------------------------- Anatomy  Thoracic:              Appears normal         Bladder:                Appears normal  Stomach:               Appears normal, left                         sided ---------------------------------------------------------------------- Impression  Gestational hypertension.  Amniotic fluid is normal and good fetal activity is seen.  Antenatal testing is reassuring. BPP 8/8.  BP at our office: 136/86 mm Hg.  Patient does not have symptoms of severe features of   preeclampsia. ---------------------------------------------------------------------- Recommendations  -Continue weekly BPP till delivery.  -Delivery at 37 weeks. ----------------------------------------------------------------------                  Noralee Space, MD Electronically Signed Final Report   10/22/2018 09:52 pm ----------------------------------------------------------------------  [place] Mfm Fetal Bpp Wo Non Stress  Result Date: 10/15/2018 ----------------------------------------------------------------------  OBSTETRICS REPORT                       (Signed Final 10/15/2018 03:51 pm) ---------------------------------------------------------------------- Patient Info  ID #:       [id number]                          D.O.B.:  01/12/1996 (23 yrs)  Name:       ANALIE KATZMAN [facility]                Visit Date: 10/15/2018 02:57 pm ---------------------------------------------------------------------- Performed By  Performed By:     Emeline Darling BS,      Ref. Address:     [address]                    [place]  [address] [place],                                                             [place] [address]  Attending:        Ma Rings MD         [facility]  Referred By:      Willodean Rosenthal MD ---------------------------------------------------------------------- Orders   #  Description                          Code         Ordered By   1  [place] MFM FETAL BPP WO NON              76819.01     Wei Newbrough      STRESS   2  [place] MFM OB FOLLOW UP                  76816.01     RAVI [facility]  ----------------------------------------------------------------------   #  Order #                    Accession #                 Episode #   1  [id number]                  [id number]                  [id number]   2  [id number]                   [id number]                  [id number]  ---------------------------------------------------------------------- Indications   Gestational hypertension without significant   O13.3   proteinuria, third trimester   Gestational diabetes in pregnancy, diet        O24.410   controlled   Obesity complicating pregnancy, third          O99.213   trimester   Fetal abnormality - other known or             O35.9XX0   suspected (PRUV)   [redacted] weeks gestation of pregnancy                Z3A.33  ---------------------------------------------------------------------- Vital Signs  Weight (lb): 279                               Height:        5'8"  BMI:         42.42 ---------------------------------------------------------------------- Fetal Evaluation  Num Of Fetuses:         1  Fetal Heart Rate(bpm):  130  Cardiac Activity:       Observed  Presentation:           Cephalic  Placenta:               Posterior  P. Cord Insertion:      Previously Visualized  Amniotic Fluid  AFI FV:      Within normal limits  AFI Sum(cm)     %Tile       Largest Pocket(cm)  20.21           76          6.01  RUQ(cm)       RLQ(cm)       LUQ(cm)        LLQ(cm)  3.98          5.88          6.01           4.34 ---------------------------------------------------------------------- Biophysical Evaluation  Amniotic F.V:   Pocket < 2 cm two          F. Tone:        Observed                  planes  F. Movement:    Observed                   Score:          8/8  F. Breathing:   Observed ---------------------------------------------------------------------- Biometry  BPD:      87.2  mm     G. Age:  35w 1d         89  %    CI:        77.79   %    70 - 86                                                          FL/HC:      20.6   %    19.9 - 21.5  HC:      312.9  mm     G. Age:  35w 0d         56  %    HC/AC:      0.95        0.96 - 1.11  AC:       329   mm     G. Age:  36w 6d       > 99  %    FL/BPD:     74.1   %    71 - 87  FL:       64.6  mm     G. Age:  33w 2d          36  %    FL/AC:      19.6   %    20 - 24  Est. FW:    2713  gm           6 lb     95  % ---------------------------------------------------------------------- OB History  Gravidity:    1  Term:   0  Living:       0 ---------------------------------------------------------------------- Gestational Age  U/S Today:     35w 1d                                        EDD:   11/18/18  Best:          33w 3d     Det. By:  Previous Ultrasound      EDD:   11/30/18                                      (05/08/18) ---------------------------------------------------------------------- Anatomy  Cranium:               Appears normal         LVOT:                   Previously seen  Cavum:                 Previously seen        Aortic Arch:            Previously seen  Ventricles:            Appears normal         Ductal Arch:            Previously seen  Choroid Plexus:        Previously seen        Diaphragm:              Appears normal  Cerebellum:            Previously seen        Stomach:                Appears normal, left                                                                        sided  Posterior Fossa:       Previously seen        Abdomen:                Persistent right                                                                        umbilical vein  Nuchal Fold:           Not applicable (>20    Abdominal Wall:         Previously seen                         wks GA)  Face:                  Orbits  and profile     Cord Vessels:           Previously seen                         previously seen  Lips:                  Previously seen        Kidneys:                Appear normal  Palate:                Previously seen        Bladder:                Appears normal  Thoracic:              Appears normal         Spine:                  Previously seen  Heart:                 Appears normal         Upper Extremities:      Previously seen                         (4CH, axis, and                         situs)   RVOT:                  Previously seen        Lower Extremities:      Previously seen  Other:  Heels and 5th digit previously visualized. Gender not well visualized.          Nasal bone previously visualized. ---------------------------------------------------------------------- Cervix Uterus Adnexa  Cervix  Not visualized (advanced GA >24wks) ---------------------------------------------------------------------- Comments  This patient was seen for a follow up growth scan due to  recently diagnosed A1 gestational diabetes and gestational  hypertension.  The patient reports that her fingersticks have  mostly been within normal limits.  The patient's blood  pressures today were noted to be in the 140s to 150s over  80s range.  She was informed that the fetal growth is measuring above  the 90th percentile for her gestational age.  There was normal  amniotic fluid noted.  A biophysical profile performed today was 8 out of 8.  The patient was encouraged to continue to monitor her  fingersticks 4 times daily.  We will continue to follow her with  weekly fetal testing.  The patient understands that should her  blood pressures remain elevated, delivery may be  recommended at around 37 weeks. ----------------------------------------------------------------------                   Ma Rings, MD Electronically Signed Final Report   10/15/2018 03:51 pm ----------------------------------------------------------------------  [place] Mfm Ob Follow Up  Result Date: 10/15/2018 ----------------------------------------------------------------------  OBSTETRICS REPORT                       (Signed Final 10/15/2018 03:51 pm) ---------------------------------------------------------------------- Patient Info  ID #:       [id number]  D.O.B.:  05/14/1995 (23 yrs)  Name:       ARMONNI SKOGEN [facility]                Visit Date: 10/15/2018 02:57 pm ---------------------------------------------------------------------- Performed  By  Performed By:     Emeline Darling BS,      Ref. Address:     [address] [place],                                                             [place] [address]  Attending:        Ma Rings MD         [facility]  Referred By:      Willodean Rosenthal MD ---------------------------------------------------------------------- Orders   #  Description                          Code         Ordered By   1  [place] MFM FETAL BPP WO NON              76819.01     Tehila Sokolow      STRESS   2  [place] MFM OB FOLLOW UP                  38250.53     RAVI [facility]  ----------------------------------------------------------------------   #  Order #                    Accession #                 Episode #   1  [id number]                  [id number]                  [id number]   2  [id number]                  [id number]                  [id number]  ---------------------------------------------------------------------- Indications   Gestational  hypertension without significant   O13.3   proteinuria, third trimester   Gestational diabetes in pregnancy, diet        O24.410   controlled   Obesity complicating pregnancy, third          O99.213   trimester   Fetal abnormality - other known or             O35.9XX0   suspected (PRUV)   [redacted] weeks gestation of pregnancy                Z3A.33  ---------------------------------------------------------------------- Vital Signs  Weight (lb): 279                               Height:        5'8"  BMI:         42.42 ---------------------------------------------------------------------- Fetal Evaluation  Num Of Fetuses:         1  Fetal Heart Rate(bpm):  130  Cardiac Activity:       Observed  Presentation:           Cephalic  Placenta:               Posterior  P. Cord Insertion:       Previously Visualized  Amniotic Fluid  AFI FV:      Within normal limits  AFI Sum(cm)     %Tile       Largest Pocket(cm)  20.21           76          6.01  RUQ(cm)       RLQ(cm)       LUQ(cm)        LLQ(cm)  3.98          5.88          6.01           4.34 ---------------------------------------------------------------------- Biophysical Evaluation  Amniotic F.V:   Pocket < 2 cm two          F. Tone:        Observed                  planes  F. Movement:    Observed                   Score:          8/8  F. Breathing:   Observed ---------------------------------------------------------------------- Biometry  BPD:      87.2  mm     G. Age:  35w 1d         89  %    CI:        77.79   %    70 - 86                                                          FL/HC:      20.6   %    19.9 - 21.5  HC:      312.9  mm     G. Age:  35w 0d         56  %    HC/AC:      0.95  0.96 - 1.11  AC:       329   mm     G. Age:  36w 6d       > 99  %    FL/BPD:     74.1   %    71 - 87  FL:       64.6  mm     G. Age:  33w 2d         36  %    FL/AC:      19.6   %    20 - 24  Est. FW:    2713  gm           6 lb     95  % ---------------------------------------------------------------------- OB History  Gravidity:    1         Term:   0  Living:       0 ---------------------------------------------------------------------- Gestational Age  U/S Today:     35w 1d                                        EDD:   11/18/18  Best:          33w 3d     Det. By:  Previous Ultrasound      EDD:   11/30/18                                      (05/08/18) ---------------------------------------------------------------------- Anatomy  Cranium:               Appears normal         LVOT:                   Previously seen  Cavum:                 Previously seen        Aortic Arch:            Previously seen  Ventricles:            Appears normal         Ductal Arch:            Previously seen  Choroid Plexus:        Previously seen        Diaphragm:               Appears normal  Cerebellum:            Previously seen        Stomach:                Appears normal, left                                                                        sided  Posterior Fossa:       Previously seen        Abdomen:                Persistent right  umbilical vein  Nuchal Fold:           Not applicable (>20    Abdominal Wall:         Previously seen                         wks GA)  Face:                  Orbits and profile     Cord Vessels:           Previously seen                         previously seen  Lips:                  Previously seen        Kidneys:                Appear normal  Palate:                Previously seen        Bladder:                Appears normal  Thoracic:              Appears normal         Spine:                  Previously seen  Heart:                 Appears normal         Upper Extremities:      Previously seen                         (4CH, axis, and                         situs)  RVOT:                  Previously seen        Lower Extremities:      Previously seen  Other:  Heels and 5th digit previously visualized. Gender not well visualized.          Nasal bone previously visualized. ---------------------------------------------------------------------- Cervix Uterus Adnexa  Cervix  Not visualized (advanced GA >24wks) ---------------------------------------------------------------------- Comments  This patient was seen for a follow up growth scan due to  recently diagnosed A1 gestational diabetes and gestational  hypertension.  The patient reports that her fingersticks have  mostly been within normal limits.  The patient's blood  pressures today were noted to be in the 140s to 150s over  80s range.  She was informed that the fetal growth is measuring above  the 90th percentile for her gestational age.  There was normal  amniotic fluid noted.  A biophysical profile performed today was 8 out  of 8.  The patient was encouraged to continue to monitor her  fingersticks 4 times daily.  We will continue to follow her with  weekly fetal testing.  The patient understands that should her  blood pressures remain elevated, delivery may be  recommended at around 37 weeks. ----------------------------------------------------------------------                   Ma RingsVictor Fang, MD Electronically Signed Final Report   10/15/2018 03:51  pm ----------------------------------------------------------------------   Assessment and Plan:  Pregnancy: G1P0 at [redacted]w[redacted]d 1. Diet controlled gestational diabetes mellitus (GDM) in third trimester Stable CBG on diet, known LGA infant.   2. Gestational hypertension, third trimester Concerned about elevated BP. No severe symptoms. Needs labs, will get tomorrow during scheduled BPP at MFM.  Severe preeclampsia precautions reviewed.  Will follow up MFM recommendations. Continue weekly antenatal testing and weekly labs until delivery at 37 weeks (or earlier if needed); induction scheduled on 11/10/18, orders placed. - CBC; Future - Comprehensive metabolic panel; Future - Protein / creatinine ratio, urine; Future  3. Supervision of high risk pregnancy in third trimester Preterm labor symptoms and general obstetric precautions including but not limited to vaginal bleeding, contractions, leaking of fluid and fetal movement were reviewed in detail with the patient. I discussed the assessment and treatment plan with the patient. The patient was provided an opportunity to ask questions and all were answered. The patient agreed with the plan and demonstrated an understanding of the instructions. The patient was advised to call back or seek an in-person office evaluation/go to MAU at [facility] for any urgent or concerning symptoms. Please refer to After Visit Summary for other counseling recommendations.   I provided 15 minutes of face-to-face time during this  encounter.  Return in about 1 week (around 11/04/2018) for OFFICE OB Visit/Pelvic Cultures but also needs lab appointment at [facility] tomorrow 9/23 ~3:30pm.  Future Appointments  Date Time Provider Department Center  10/29/2018  3:00 PM WH-MFC NURSE WH-MFC MFC-US  10/29/2018  3:00 PM WH-MFC [place] 3 WH-MFCUS MFC-US  10/29/2018  4:00 PM WOC-WOCA LAB WOC-WOCA WOC  11/05/2018  1:45 PM WH-MFC NURSE WH-MFC MFC-US  11/05/2018  1:45 PM WH-MFC [place] 5 WH-MFCUS MFC-US  11/06/2018  1:45 PM Mattawan Bing, MD [facility]  11/10/2018  7:30 AM MC-LD SCHED ROOM MC-INDC None    Jaynie Collins, MD [facility] for [facility]

## 2018-10-28 NOTE — Patient Instructions (Signed)
Return to office for any scheduled appointments. Call the office or go to the MAU at [facility] [place] if:  You begin to have strong, frequent contractions  Your water breaks.  Sometimes it is a big gush of fluid, sometimes it is just a trickle that keeps getting your panties wet or running down your legs  You have vaginal bleeding.  It is normal to have a small amount of spotting if your cervix was checked.   You do not feel your baby moving like normal.  If you do not, get something to eat and drink and lay down and focus on feeling your baby move.   If your baby is still not moving like normal, you should call the office or go to MAU.  Any other obstetric concerns.   Labor Induction  Labor induction is when steps are taken to cause a pregnant woman to begin the labor process. Most women go into labor on their own between 37 weeks and 42 weeks of pregnancy. When this does not happen or when there is a medical need for labor to begin, steps may be taken to induce labor. Labor induction causes a pregnant woman's uterus to contract. It also causes the cervix to soften (ripen), open (dilate), and thin out (efface). Usually, labor is not induced before 39 weeks of pregnancy unless there is a medical reason to do so. Your health care provider will determine if labor induction is needed. Before inducing labor, your health care provider will consider a number of factors, including:  Your medical condition and your baby's.  How many weeks along you are in your pregnancy.  How mature your baby's lungs are.  The condition of your cervix.  The position of your baby.  The size of your birth canal. What are some reasons for labor induction? Labor may be induced if:  Your health or your baby's health is at risk.  Your pregnancy is overdue by 1 week or more.  Your water breaks but labor does not start on its own.  There is a low amount of amniotic fluid around your  baby. You may also choose (elect) to have labor induced at a certain time. Generally, elective labor induction is done no earlier than 39 weeks of pregnancy. What methods are used for labor induction? Methods used for labor induction include:  Prostaglandin medicine. This medicine starts contractions and causes the cervix to dilate and ripen. It can be taken by mouth (orally) or by being inserted into the vagina (suppository).  Inserting a small, thin tube (catheter) with a balloon into the vagina and then expanding the balloon with water to dilate the cervix.  Stripping the membranes. In this method, your health care provider gently separates amniotic sac tissue from the cervix. This causes the cervix to stretch, which in turn causes the release of a hormone called progesterone. The hormone causes the uterus to contract. This procedure is often done during an office visit, after which you will be sent home to wait for contractions to begin.  Breaking the water. In this method, your health care provider uses a small instrument to make a small hole in the amniotic sac. This eventually causes the amniotic sac to break. Contractions should begin after a few hours.  Medicine to trigger or strengthen contractions. This medicine is given through an IV that is inserted into a vein in your arm. Except for membrane stripping, which can be done in a clinic, labor   induction is done in the hospital so that you and your baby can be carefully monitored. How long does it take for labor to be induced? The length of time it takes to induce labor depends on how ready your body is for labor. Some inductions can take up to 2-3 days, while others may take less than a day. Induction may take longer if:  You are induced early in your pregnancy.  It is your first pregnancy.  Your cervix is not ready. What are some risks associated with labor induction? Some risks associated with labor induction include:  Changes  in fetal heart rate, such as being too high, too low, or irregular (erratic).  Failed induction.  Infection in the mother or the baby.  Increased risk of having a cesarean delivery.  Fetal death.  Breaking off (abruption) of the placenta from the uterus (rare).  Rupture of the uterus (very rare). When induction is needed for medical reasons, the benefits of induction generally outweigh the risks. What are some reasons for not inducing labor? Labor induction should not be done if:  Your baby does not tolerate contractions.  You have had previous surgeries on your uterus, such as a myomectomy, removal of fibroids, or a vertical scar from a previous cesarean delivery.  Your placenta lies very low in your uterus and blocks the opening of the cervix (placenta previa).  Your baby is not in a head-down position.  The umbilical cord drops down into the birth canal in front of the baby.  There are unusual circumstances, such as the baby being very early (premature).  You have had more than 2 previous cesarean deliveries. Summary  Labor induction is when steps are taken to cause a pregnant woman to begin the labor process.  Labor induction causes a pregnant woman's uterus to contract. It also causes the cervix to ripen, dilate, and efface.  Labor is not induced before 39 weeks of pregnancy unless there is a medical reason to do so.  When induction is needed for medical reasons, the benefits of induction generally outweigh the risks. This information is not intended to replace advice given to you by your health care provider. Make sure you discuss any questions you have with your health care provider. Document Released: 06/13/2006 Document Revised: 01/25/2017 Document Reviewed: 03/07/2016 Elsevier Patient Education  2020 [employer]   Hypertension During Pregnancy High blood pressure (hypertension) is when the force of blood pumping through the arteries is too strong. Arteries  are blood vessels that carry blood from the heart throughout the body. Hypertension during pregnancy can be mild or severe. Severe hypertension during pregnancy (preeclampsia) is a medical emergency that requires prompt evaluation and treatment. Different types of hypertension can happen during pregnancy. These include:  Chronic hypertension. This happens when you had high blood pressure before you became pregnant, and it continues during the pregnancy. Hypertension that develops before you are [redacted] weeks pregnant and continues during the pregnancy is also called chronic hypertension. If you have chronic hypertension, it will not go away after you have your baby. You will need follow-up visits with your health care provider after you have your baby. Your doctor may want you to keep taking medicine for your blood pressure.  Gestational hypertension. This is hypertension that develops after the 20th week of pregnancy. Gestational hypertension usually goes away after you have your baby, but your health care provider will need to monitor your blood pressure to make sure that it is getting better.  Preeclampsia. This is severe hypertension during pregnancy. This can cause serious complications for you and your baby and can also cause complications for you after the delivery of your baby.  Postpartum preeclampsia. You may develop severe hypertension after giving birth. This usually occurs within 48 hours after childbirth but may occur up to 6 weeks after giving birth. This is rare. How does this affect me? Women who have hypertension during pregnancy have a greater chance of developing hypertension later in life or during future pregnancies. In some cases, hypertension during pregnancy can cause serious complications, such as:  Stroke.  Heart attack.  Injury to other organs, such as kidneys, lungs, or liver.  Preeclampsia.  Convulsions or seizures.  Placental abruption. How does this affect my baby?  Hypertension during pregnancy can affect your baby. Your baby may:  Be born early (prematurely).  Not weigh as much as he or she should at birth (low birth weight).  Not tolerate labor well, leading to an unplanned cesarean delivery. What are the risks? There are certain factors that make it more likely for you to develop hypertension during pregnancy. These include:  Having hypertension during a previous pregnancy.  Being overweight.  Being age 27 or older.  Being pregnant for the first time.  Being pregnant with more than one baby.  Becoming pregnant using fertilization methods, such as IVF (in vitro fertilization).  Having other medical problems, such as diabetes, kidney disease, or lupus.  Having a family history of hypertension. What can I do to lower my risk? The exact cause of hypertension during pregnancy is not known. You may be able to lower your risk by:  Maintaining a healthy weight.  Eating a healthy and balanced diet.  Following your health care provider's instructions about treating any long-term conditions that you had before becoming pregnant. It is very important to keep all of your prenatal care appointments. Your health care provider will check your blood pressure and make sure that your pregnancy is progressing as expected. If a problem is found, early treatment can prevent complications. How is this treated? Treatment for hypertension during pregnancy varies depending on the type of hypertension you have and how serious it is.  If you were taking medicine for high blood pressure before you became pregnant, talk with your health care provider. You may need to change medicine during pregnancy because some medicines, like ACE inhibitors, may not be considered safe for your baby.  If you have gestational hypertension, your health care provider may order medicine to treat this during pregnancy.  If you are at risk for preeclampsia, your health care provider  may recommend that you take a low-dose aspirin during your pregnancy.  If you have severe hypertension, you may need to be hospitalized so you and your baby can be monitored closely. You may also need to be given medicine to lower your blood pressure. This medicine may be given by mouth or through an IV.  In some cases, if your condition gets worse, you may need to deliver your baby early. Follow these instructions at home: Eating and drinking   Drink enough fluid to keep your urine pale yellow.  Avoid caffeine. Lifestyle  Do not use any products that contain nicotine or tobacco, such as cigarettes, e-cigarettes, and chewing tobacco. If you need help quitting, ask your health care provider.  Do not use alcohol or drugs.  Avoid stress as much as possible.  Rest and get plenty of sleep.  Regular exercise can help  to reduce your blood pressure. Ask your health care provider what kinds of exercise are best for you. General instructions  Take over-the-counter and prescription medicines only as told by your health care provider.  Keep all prenatal and follow-up visits as told by your health care provider. This is important. Contact a health care provider if:  You have symptoms that your health care provider told you may require more treatment or monitoring, such as: ? Headaches. ? Nausea or vomiting. ? Abdominal pain. ? Dizziness. ? Light-headedness. Get help right away if:  You have: ? Severe abdominal pain that does not get better with treatment. ? A severe headache that does not get better. ? Vomiting that does not get better. ? Sudden, rapid weight gain. ? Sudden swelling in your hands, ankles, or face. ? Vaginal bleeding. ? Blood in your urine. ? Blurred or double vision. ? Shortness of breath or chest pain. ? Weakness on one side of your body. ? Difficulty speaking.  Your baby is not moving as much as usual. Summary  High blood pressure (hypertension) is when the  force of blood pumping through the arteries is too strong.  Hypertension during pregnancy can cause problems for you and your baby.  Treatment for hypertension during pregnancy varies depending on the type of hypertension you have and how serious it is.  Keep all prenatal and follow-up visits as told by your health care provider. This is important. This information is not intended to replace advice given to you by your health care provider. Make sure you discuss any questions you have with your health care provider. Document Released: 10/10/2010 Document Revised: 05/15/2018 Document Reviewed: 02/18/2018 Elsevier Patient Education  2020 [employer].

## 2018-10-28 NOTE — Telephone Encounter (Signed)
Preadmission screen  

## 2018-10-29 ENCOUNTER — Other Ambulatory Visit: Payer: BC Managed Care – PPO

## 2018-10-29 ENCOUNTER — Ambulatory Visit (HOSPITAL_COMMUNITY)
Admission: RE | Admit: 2018-10-29 | Discharge: 2018-10-29 | Disposition: A | Discharge status: Home / Self Care | Payer: BC Managed Care – PPO | Source: Ambulatory Visit | Attending: Obstetrics and Gynecology | Admitting: Obstetrics and Gynecology

## 2018-10-29 ENCOUNTER — Ambulatory Visit (HOSPITAL_COMMUNITY): Payer: BC Managed Care – PPO | Admitting: *Deleted

## 2018-10-29 ENCOUNTER — Encounter (HOSPITAL_COMMUNITY): Payer: Self-pay

## 2018-10-29 DIAGNOSIS — Z3A35 35 weeks gestation of pregnancy: Secondary | ICD-10-CM | POA: Diagnosis not present

## 2018-10-29 DIAGNOSIS — O0993 Supervision of high risk pregnancy, unspecified, third trimester: Secondary | ICD-10-CM | POA: Insufficient documentation

## 2018-10-29 DIAGNOSIS — O133 Gestational [pregnancy-induced] hypertension without significant proteinuria, third trimester: Secondary | ICD-10-CM | POA: Diagnosis not present

## 2018-10-29 DIAGNOSIS — O2603 Excessive weight gain in pregnancy, third trimester: Secondary | ICD-10-CM | POA: Diagnosis not present

## 2018-10-29 DIAGNOSIS — O359XX0 Maternal care for (suspected) fetal abnormality and damage, unspecified, not applicable or unspecified: Secondary | ICD-10-CM

## 2018-10-29 DIAGNOSIS — O24410 Gestational diabetes mellitus in pregnancy, diet controlled: Secondary | ICD-10-CM | POA: Diagnosis not present

## 2018-10-29 DIAGNOSIS — O99213 Obesity complicating pregnancy, third trimester: Secondary | ICD-10-CM

## 2018-10-30 LAB — COMPREHENSIVE METABOLIC PANEL
ALT: 7 IU/L (ref 0–32)
AST: 10 IU/L (ref 0–40)
Albumin/Globulin Ratio: 1.5 (ref 1.2–2.2)
Albumin: 3.5 g/dL — ABNORMAL LOW (ref 3.9–5.0)
Alkaline Phosphatase: 154 IU/L — ABNORMAL HIGH (ref 39–117)
BUN/Creatinine Ratio: 18 (ref 9–23)
BUN: 11 mg/dL (ref 6–20)
Bilirubin Total: <0.2 mg/dL (ref 0.0–1.2)
CO2: 19 mmol/L — ABNORMAL LOW (ref 20–29)
Calcium: 9.4 mg/dL (ref 8.7–10.2)
Chloride: 106 mmol/L (ref 96–106)
Creatinine, Ser: 0.62 mg/dL (ref 0.57–1.00)
GFR calc Af Amer: 146 mL/min/{1.73_m2} (ref >59)
GFR calc non Af Amer: 127 mL/min/{1.73_m2} (ref >59)
Globulin, Total: 2.3 g/dL (ref 1.5–4.5)
Glucose: 104 mg/dL — ABNORMAL HIGH (ref 65–99)
Potassium: 4.4 mmol/L (ref 3.5–5.2)
Sodium: 139 mmol/L (ref 134–144)
Total Protein: 5.8 g/dL — ABNORMAL LOW (ref 6.0–8.5)

## 2018-10-30 LAB — CBC
Hematocrit: 35.6 % (ref 34.0–46.6)
Hemoglobin: 12 g/dL (ref 11.1–15.9)
MCH: 29.9 pg (ref 26.6–33.0)
MCHC: 33.7 g/dL (ref 31.5–35.7)
MCV: 89 fL (ref 79–97)
Platelets: 249 10*3/uL (ref 150–450)
RBC: 4.02 x10E6/uL (ref 3.77–5.28)
RDW: 13.1 % (ref 11.7–15.4)
WBC: 10.8 10*3/uL (ref 3.4–10.8)

## 2018-10-30 LAB — PROTEIN / CREATININE RATIO, URINE
Creatinine, Urine: 289 mg/dL
Protein, Ur: 26.9 mg/dL
Protein/Creat Ratio: 93 mg/g creat (ref 0–200)

## 2018-11-05 ENCOUNTER — Encounter (HOSPITAL_COMMUNITY): Payer: Self-pay

## 2018-11-05 ENCOUNTER — Ambulatory Visit (HOSPITAL_COMMUNITY): Payer: BC Managed Care – PPO | Admitting: *Deleted

## 2018-11-05 ENCOUNTER — Other Ambulatory Visit: Payer: Self-pay | Admitting: Advanced Practice Midwife

## 2018-11-05 ENCOUNTER — Other Ambulatory Visit: Payer: Self-pay

## 2018-11-05 ENCOUNTER — Ambulatory Visit (HOSPITAL_COMMUNITY)
Admission: RE | Admit: 2018-11-05 | Discharge: 2018-11-05 | Disposition: A | Discharge status: Home / Self Care | Payer: BC Managed Care – PPO | Source: Ambulatory Visit | Attending: Obstetrics and Gynecology | Admitting: Obstetrics and Gynecology

## 2018-11-05 DIAGNOSIS — O2603 Excessive weight gain in pregnancy, third trimester: Secondary | ICD-10-CM

## 2018-11-05 DIAGNOSIS — O0993 Supervision of high risk pregnancy, unspecified, third trimester: Secondary | ICD-10-CM | POA: Diagnosis not present

## 2018-11-05 DIAGNOSIS — O24410 Gestational diabetes mellitus in pregnancy, diet controlled: Secondary | ICD-10-CM | POA: Diagnosis not present

## 2018-11-05 DIAGNOSIS — Z3A36 36 weeks gestation of pregnancy: Secondary | ICD-10-CM | POA: Diagnosis not present

## 2018-11-05 DIAGNOSIS — O133 Gestational [pregnancy-induced] hypertension without significant proteinuria, third trimester: Secondary | ICD-10-CM

## 2018-11-05 DIAGNOSIS — O99213 Obesity complicating pregnancy, third trimester: Secondary | ICD-10-CM | POA: Diagnosis not present

## 2018-11-05 DIAGNOSIS — O359XX0 Maternal care for (suspected) fetal abnormality and damage, unspecified, not applicable or unspecified: Secondary | ICD-10-CM | POA: Diagnosis not present

## 2018-11-06 ENCOUNTER — Telehealth: Payer: Self-pay | Admitting: Obstetrics & Gynecology

## 2018-11-06 ENCOUNTER — Other Ambulatory Visit (HOSPITAL_COMMUNITY)
Admission: RE | Admit: 2018-11-06 | Discharge: 2018-11-06 | Disposition: A | Discharge status: Home / Self Care | Payer: BC Managed Care – PPO | Source: Ambulatory Visit | Attending: Obstetrics and Gynecology | Admitting: Obstetrics and Gynecology

## 2018-11-06 ENCOUNTER — Ambulatory Visit (INDEPENDENT_AMBULATORY_CARE_PROVIDER_SITE_OTHER): Payer: BC Managed Care – PPO | Admitting: Obstetrics and Gynecology

## 2018-11-06 VITALS — BP 134/84 | HR 86 | Wt 290.6 lb

## 2018-11-06 DIAGNOSIS — O0993 Supervision of high risk pregnancy, unspecified, third trimester: Secondary | ICD-10-CM

## 2018-11-06 DIAGNOSIS — O133 Gestational [pregnancy-induced] hypertension without significant proteinuria, third trimester: Secondary | ICD-10-CM | POA: Insufficient documentation

## 2018-11-06 DIAGNOSIS — A5901 Trichomonal vulvovaginitis: Secondary | ICD-10-CM

## 2018-11-06 DIAGNOSIS — O99210 Obesity complicating pregnancy, unspecified trimester: Secondary | ICD-10-CM

## 2018-11-06 DIAGNOSIS — O23593 Infection of other part of genital tract in pregnancy, third trimester: Secondary | ICD-10-CM

## 2018-11-06 DIAGNOSIS — O24410 Gestational diabetes mellitus in pregnancy, diet controlled: Secondary | ICD-10-CM

## 2018-11-06 DIAGNOSIS — O23591 Infection of other part of genital tract in pregnancy, first trimester: Secondary | ICD-10-CM

## 2018-11-06 DIAGNOSIS — Z3A36 36 weeks gestation of pregnancy: Secondary | ICD-10-CM

## 2018-11-06 DIAGNOSIS — E669 Obesity, unspecified: Secondary | ICD-10-CM

## 2018-11-06 DIAGNOSIS — O99213 Obesity complicating pregnancy, third trimester: Secondary | ICD-10-CM

## 2018-11-06 LAB — POCT URINALYSIS DIPSTICK OB

## 2018-11-06 NOTE — Telephone Encounter (Signed)
[facility] Phys[facility] Phone Call Documentation  I placed a call to [place] after [employer] called after hours service reporting a BP of 168/78.  She has known diagnosis of GHTN, also has A1GDM.  Patient reports that she has "pain under right breast and right rib"; denies any headaches, visual symptoms or other concerning symptoms.  Patient was seen in office today, had BP 134/84 and labs were checked (still pending).  Patient also reports that her arm "got swollen bad" after she checked her BP.  Given concern about severe features, she was told to come in the MAU for evaluation for preeclampsia.  Of note, patient was already scheduled for IOL at [redacted]w[redacted]d; she is currently [redacted]w[redacted]d.  MAU RN in charge and Provider notified.     Verita Schneiders, MD, [place] [facility] [employer], [place]

## 2018-11-06 NOTE — Progress Notes (Addendum)
Prenatal Visit Note Date: 11/06/2018 Clinic: [facility]  Subjective:  Valerie Crane is a 23 y.o. G1P0 at [redacted]w[redacted]d being seen today for ongoing prenatal care.  She is currently monitored for the following issues for this high-risk pregnancy and has Supervision of high-risk pregnancy; Excessive weight gain during pregnancy in third trimester; Trichomonal vaginitis during pregnancy in first trimester; GDM (gestational diabetes mellitus); BMI 30s; Obesity in pregnancy; Gestational hypertension, third trimester; and LGA (large for gestational age) fetus affecting management of mother, third trimester on their problem list.  Patient reports no complaints.  No s/s of pre-eclampsia Contractions: Not present. Vag. Bleeding: None.  Movement: Present. Denies leaking of fluid.   The following portions of the patient's history were reviewed and updated as appropriate: allergies, current medications, past family history, past medical history, past social history, past surgical history and problem list. Problem list updated.  Objective:   Vitals:   11/06/18 1406  BP: 134/84  Pulse: 86  Weight: 290 lb 9.6 oz (131.8 kg)    Fetal Status: Fetal Heart Rate (bpm): rNST   Movement: Present  Presentation: Vertex  General:  Alert, oriented and cooperative. Patient is in no acute distress.  Skin: Skin is warm and dry. No rash noted.   Cardiovascular: Normal heart rate noted  Respiratory: Normal respiratory effort, no problems with respiration noted  Abdomen: Soft, gravid, appropriate for gestational age. Pain/Pressure: Absent     Pelvic:  Cervical exam performed Dilation: Closed Effacement (%): Thick Station: Ballotable  Extremities: Normal range of motion.  Edema: Mild pitting, slight indentation  Mental Status: Normal mood and affect. Normal behavior. Normal judgment and thought content.   Urinalysis:      Assessment and Plan:  Pregnancy: G1P0 at [redacted]w[redacted]d  1. Gestational  hypertension, third trimester Trace protein on udip Doing well on no meds. Surveillance labs today. NST reactive and bpp 8/8 yesterday. Pre-x precautions given. Already set up for IOL in a few days 120 baseline, +accels, no decel, mod variability, toco quiet x 4m - CBC - CMP and Liver - Protein / creatinine ratio, urine - Strep Gp B NAA - Cervicovaginal ancillary only( [place])  2. Diet controlled gestational diabetes mellitus (GDM) in third trimester Normal BS log  3. Trichomonal vaginitis during pregnancy in first trimester Testing today  4. Supervision of high risk pregnancy in third trimester gbs today  5. Obesity in pregnancy  6. BMI 30s  Preterm labor symptoms and general obstetric precautions including but not limited to vaginal bleeding, contractions, leaking of fluid and fetal movement were reviewed in detail with the patient. Please refer to After Visit Summary for other counseling recommendations.  Return in about 2 weeks (around 11/20/2018) for bp check, rn visit, virtual visit.   Aletha Halim, MD

## 2018-11-06 NOTE — Addendum Note (Signed)
Addended by: Phillip Heal, Everet Flagg A on: 11/06/2018 02:51 PM   Modules accepted: Orders

## 2018-11-06 NOTE — Progress Notes (Signed)
ll

## 2018-11-07 ENCOUNTER — Inpatient Hospital Stay (HOSPITAL_COMMUNITY)
Admission: AD | Admit: 2018-11-07 | Discharge: 2018-11-07 | Disposition: A | Discharge status: Home / Self Care | Payer: BC Managed Care – PPO | Attending: Obstetrics & Gynecology | Admitting: Obstetrics & Gynecology

## 2018-11-07 ENCOUNTER — Encounter: Payer: Self-pay | Admitting: Obstetrics & Gynecology

## 2018-11-07 ENCOUNTER — Encounter (HOSPITAL_COMMUNITY): Payer: Self-pay

## 2018-11-07 ENCOUNTER — Other Ambulatory Visit: Payer: Self-pay

## 2018-11-07 DIAGNOSIS — Z87891 Personal history of nicotine dependence: Secondary | ICD-10-CM | POA: Diagnosis not present

## 2018-11-07 DIAGNOSIS — O1493 Unspecified pre-eclampsia, third trimester: Secondary | ICD-10-CM | POA: Diagnosis not present

## 2018-11-07 DIAGNOSIS — O133 Gestational [pregnancy-induced] hypertension without significant proteinuria, third trimester: Secondary | ICD-10-CM | POA: Insufficient documentation

## 2018-11-07 DIAGNOSIS — Z3A36 36 weeks gestation of pregnancy: Secondary | ICD-10-CM

## 2018-11-07 DIAGNOSIS — M940 Chondrocostal junction syndrome [Tietze]: Secondary | ICD-10-CM

## 2018-11-07 DIAGNOSIS — O24410 Gestational diabetes mellitus in pregnancy, diet controlled: Secondary | ICD-10-CM | POA: Diagnosis not present

## 2018-11-07 LAB — COMPREHENSIVE METABOLIC PANEL
ALT: 11 U/L (ref 0–44)
AST: 15 U/L (ref 15–41)
Albumin: 2.6 g/dL — ABNORMAL LOW (ref 3.5–5.0)
Alkaline Phosphatase: 133 U/L — ABNORMAL HIGH (ref 38–126)
Anion gap: 11 (ref 5–15)
BUN: 11 mg/dL (ref 6–20)
CO2: 19 mmol/L — ABNORMAL LOW (ref 22–32)
Calcium: 8.7 mg/dL — ABNORMAL LOW (ref 8.9–10.3)
Chloride: 106 mmol/L (ref 98–111)
Creatinine, Ser: 0.64 mg/dL (ref 0.44–1.00)
GFR calc Af Amer: >60 mL/min (ref >60)
GFR calc non Af Amer: >60 mL/min (ref >60)
Glucose, Bld: 96 mg/dL (ref 70–99)
Potassium: 4 mmol/L (ref 3.5–5.1)
Sodium: 136 mmol/L (ref 135–145)
Total Bilirubin: 0.2 mg/dL — ABNORMAL LOW (ref 0.3–1.2)
Total Protein: 6 g/dL — ABNORMAL LOW (ref 6.5–8.1)

## 2018-11-07 LAB — CBC
HCT: 34.5 % — ABNORMAL LOW (ref 36.0–46.0)
Hematocrit: 34.8 % (ref 34.0–46.6)
Hemoglobin: 11.8 g/dL — ABNORMAL LOW (ref 12.0–15.0)
Hemoglobin: 11.8 g/dL (ref 11.1–15.9)
MCH: 30.4 pg (ref 26.0–34.0)
MCH: 29.3 pg (ref 26.6–33.0)
MCHC: 34.2 g/dL (ref 30.0–36.0)
MCHC: 33.9 g/dL (ref 31.5–35.7)
MCV: 88.9 fL (ref 80.0–100.0)
MCV: 86 fL (ref 79–97)
Platelets: 253 10*3/uL (ref 150–400)
Platelets: 248 10*3/uL (ref 150–450)
RBC: 3.88 MIL/uL (ref 3.87–5.11)
RBC: 4.03 x10E6/uL (ref 3.77–5.28)
RDW: 13.2 % (ref 11.5–15.5)
RDW: 12.9 % (ref 11.7–15.4)
WBC: 9.8 10*3/uL (ref 4.0–10.5)
WBC: 9.7 10*3/uL (ref 3.4–10.8)
nRBC: 0 % (ref 0.0–0.2)

## 2018-11-07 LAB — PROTEIN / CREATININE RATIO, URINE
Creatinine, Urine: 120.59 mg/dL
Creatinine, Urine: 112.2 mg/dL
Protein Creatinine Ratio: 0.55 mg/mg{Cre} — ABNORMAL HIGH (ref 0.00–0.15)
Protein, Ur: 28.1 mg/dL
Protein/Creat Ratio: 250 mg/g creat — ABNORMAL HIGH (ref 0–200)
Total Protein, Urine: 66 mg/dL

## 2018-11-07 LAB — CMP AND LIVER
ALT: 10 IU/L (ref 0–32)
AST: 14 IU/L (ref 0–40)
Albumin: 3.5 g/dL — ABNORMAL LOW (ref 3.9–5.0)
Alkaline Phosphatase: 160 IU/L — ABNORMAL HIGH (ref 39–117)
BUN: 10 mg/dL (ref 6–20)
Bilirubin Total: <0.2 mg/dL (ref 0.0–1.2)
Bilirubin, Direct: 0.06 mg/dL (ref 0.00–0.40)
CO2: 19 mmol/L — ABNORMAL LOW (ref 20–29)
Calcium: 9.1 mg/dL (ref 8.7–10.2)
Chloride: 105 mmol/L (ref 96–106)
Creatinine, Ser: 0.66 mg/dL (ref 0.57–1.00)
GFR calc Af Amer: 143 mL/min/{1.73_m2} (ref >59)
GFR calc non Af Amer: 124 mL/min/{1.73_m2} (ref >59)
Glucose: 77 mg/dL (ref 65–99)
Potassium: 4.6 mmol/L (ref 3.5–5.2)
Sodium: 137 mmol/L (ref 134–144)
Total Protein: 5.8 g/dL — ABNORMAL LOW (ref 6.0–8.5)

## 2018-11-07 LAB — CERVICOVAGINAL ANCILLARY ONLY
Chlamydia: NEGATIVE
Neisseria Gonorrhea: NEGATIVE
Trichomonas: NEGATIVE

## 2018-11-07 NOTE — Discharge Instructions (Signed)
Preeclampsia and Eclampsia °Preeclampsia is a serious condition that may develop during pregnancy. This condition causes high blood pressure and increased protein in your urine along with other symptoms, such as headaches and vision changes. These symptoms may develop as the condition gets worse. Preeclampsia may occur at 20 weeks of pregnancy or later. °Diagnosing and treating preeclampsia early is very important. If not treated early, it can cause serious problems for you and your baby. One problem it can lead to is eclampsia. Eclampsia is a condition that causes muscle jerking or shaking (convulsions or seizures) and other serious problems for the mother. During pregnancy, delivering your baby may be the best treatment for preeclampsia or eclampsia. For most women, preeclampsia and eclampsia symptoms go away after giving birth. °In rare cases, a woman may develop preeclampsia after giving birth (postpartum preeclampsia). This usually occurs within 48 hours after childbirth but may occur up to 6 weeks after giving birth. °What are the causes? °The cause of preeclampsia is not known. °What increases the risk? °The following risk factors make you more likely to develop preeclampsia: °· Being pregnant for the first time. °· Having had preeclampsia during a past pregnancy. °· Having a family history of preeclampsia. °· Having high blood pressure. °· Being pregnant with more than one baby. °· Being 35 or older. °· Being African-American. °· Having kidney disease or diabetes. °· Having medical conditions such as lupus or blood diseases. °· Being very overweight (obese). °What are the signs or symptoms? °The most common symptoms are: °· Severe headaches. °· Vision problems, such as blurred or double vision. °· Abdominal pain, especially upper abdominal pain. °Other symptoms that may develop as the condition gets worse include: °· Sudden weight gain. °· Sudden swelling of the hands, face, legs, and feet. °· Severe nausea  and vomiting. °· Numbness in the face, arms, legs, and feet. °· Dizziness. °· Urinating less than usual. °· Slurred speech. °· Convulsions or seizures. °How is this diagnosed? °There are no screening tests for preeclampsia. Your health care provider will ask you about symptoms and check for signs of preeclampsia during your prenatal visits. You may also have tests that include: °· Checking your blood pressure. °· Urine tests to check for protein. Your health care provider will check for this at every prenatal visit. °· Blood tests. °· Monitoring your baby's heart rate. °· Ultrasound. °How is this treated? °You and your health care provider will determine the treatment approach that is best for you. Treatment may include: °· Having more frequent prenatal exams to check for signs of preeclampsia, if you have an increased risk for preeclampsia. °· Medicine to lower your blood pressure. °· Staying in the hospital, if your condition is severe. There, treatment will focus on controlling your blood pressure and the amount of fluids in your body (fluid retention). °· Taking medicine (magnesium sulfate) to prevent seizures. This may be given as an injection or through an IV. °· Taking a low-dose aspirin during your pregnancy. °· Delivering your baby early. You may have your labor started with medicine (induced), or you may have a cesarean delivery. °Follow these instructions at home: °Eating and drinking ° °· Drink enough fluid to keep your urine pale yellow. °· Avoid caffeine. °Lifestyle °· Do not use any products that contain nicotine or tobacco, such as cigarettes and e-cigarettes. If you need help quitting, ask your health care provider. °· Do not use alcohol or drugs. °· Avoid stress as much as possible. Rest and get   plenty of sleep. General instructions  Take over-the-counter and prescription medicines only as told by your health care provider.  When lying down, lie on your left side. This keeps pressure off your  major blood vessels.  When sitting or lying down, raise (elevate) your feet. Try putting some pillows underneath your lower legs.  Exercise regularly. Ask your health care provider what kinds of exercise are best for you.  Keep all follow-up and prenatal visits as told by your health care provider. This is important. How is this prevented? There is no known way of preventing preeclampsia or eclampsia from developing. However, to lower your risk of complications and detect problems early:  Get regular prenatal care. Your health care provider may be able to diagnose and treat the condition early.  Maintain a healthy weight. Ask your health care provider for help managing weight gain during pregnancy.  Work with your health care provider to manage any long-term (chronic) health conditions you have, such as diabetes or kidney problems.  You may have tests of your blood pressure and kidney function after giving birth.  Your health care provider may have you take low-dose aspirin during your next pregnancy. Contact a health care provider if:  You have symptoms that your health care provider told you may require more treatment or monitoring, such as: ? Headaches. ? Nausea or vomiting. ? Abdominal pain. ? Dizziness. ? Light-headedness. Get help right away if:  You have severe: ? Abdominal pain. ? Headaches that do not get better. ? Dizziness. ? Vision problems. ? Confusion. ? Nausea or vomiting.  You have any of the following: ? A seizure. ? Sudden, rapid weight gain. ? Sudden swelling in your hands, ankles, or face. ? Trouble moving any part of your body. ? Numbness in any part of your body. ? Trouble speaking. ? Abnormal bleeding.  You faint. Summary  Preeclampsia is a serious condition that may develop during pregnancy.  This condition causes high blood pressure and increased protein in your urine along with other symptoms, such as headaches and vision  changes.  Diagnosing and treating preeclampsia early is very important. If not treated early, it can cause serious problems for you and your baby.  Get help right away if you have symptoms that your health care provider told you to watch for. This information is not intended to replace advice given to you by your health care provider. Make sure you discuss any questions you have with your health care provider. Document Released: 01/20/2000 Document Revised: 09/24/2017 Document Reviewed: 08/29/2015 Elsevier Patient Education  2020 [employer].   Labor Induction  Labor induction is when steps are taken to cause a pregnant woman to begin the labor process. Most women go into labor on their own between 37 weeks and 42 weeks of pregnancy. When this does not happen or when there is a medical need for labor to begin, steps may be taken to induce labor. Labor induction causes a pregnant woman's uterus to contract. It also causes the cervix to soften (ripen), open (dilate), and thin out (efface). Usually, labor is not induced before 39 weeks of pregnancy unless there is a medical reason to do so. Your health care provider will determine if labor induction is needed. Before inducing labor, your health care provider will consider a number of factors, including:  Your medical condition and your baby's.  How many weeks along you are in your pregnancy.  How mature your baby's lungs are.  The condition of your  cervix.  The position of your baby.  The size of your birth canal. What are some reasons for labor induction? Labor may be induced if:  Your health or your baby's health is at risk.  Your pregnancy is overdue by 1 week or more.  Your water breaks but labor does not start on its own.  There is a low amount of amniotic fluid around your baby. You may also choose (elect) to have labor induced at a certain time. Generally, elective labor induction is done no earlier than 39 weeks of  pregnancy. What methods are used for labor induction? Methods used for labor induction include:  Prostaglandin medicine. This medicine starts contractions and causes the cervix to dilate and ripen. It can be taken by mouth (orally) or by being inserted into the vagina (suppository).  Inserting a small, thin tube (catheter) with a balloon into the vagina and then expanding the balloon with water to dilate the cervix.  Stripping the membranes. In this method, your health care provider gently separates amniotic sac tissue from the cervix. This causes the cervix to stretch, which in turn causes the release of a hormone called progesterone. The hormone causes the uterus to contract. This procedure is often done during an office visit, after which you will be sent home to wait for contractions to begin.  Breaking the water. In this method, your health care provider uses a small instrument to make a small hole in the amniotic sac. This eventually causes the amniotic sac to break. Contractions should begin after a few hours.  Medicine to trigger or strengthen contractions. This medicine is given through an IV that is inserted into a vein in your arm. Except for membrane stripping, which can be done in a clinic, labor induction is done in the hospital so that you and your baby can be carefully monitored. How long does it take for labor to be induced? The length of time it takes to induce labor depends on how ready your body is for labor. Some inductions can take up to 2-3 days, while others may take less than a day. Induction may take longer if:  You are induced early in your pregnancy.  It is your first pregnancy.  Your cervix is not ready. What are some risks associated with labor induction? Some risks associated with labor induction include:  Changes in fetal heart rate, such as being too high, too low, or irregular (erratic).  Failed induction.  Infection in the mother or the  baby.  Increased risk of having a cesarean delivery.  Fetal death.  Breaking off (abruption) of the placenta from the uterus (rare).  Rupture of the uterus (very rare). When induction is needed for medical reasons, the benefits of induction generally outweigh the risks. What are some reasons for not inducing labor? Labor induction should not be done if:  Your baby does not tolerate contractions.  You have had previous surgeries on your uterus, such as a myomectomy, removal of fibroids, or a vertical scar from a previous cesarean delivery.  Your placenta lies very low in your uterus and blocks the opening of the cervix (placenta previa).  Your baby is not in a head-down position.  The umbilical cord drops down into the birth canal in front of the baby.  There are unusual circumstances, such as the baby being very early (premature).  You have had more than 2 previous cesarean deliveries. Summary  Labor induction is when steps are taken to cause a pregnant  woman to begin the labor process.  Labor induction causes a pregnant woman's uterus to contract. It also causes the cervix to ripen, dilate, and efface.  Labor is not induced before 39 weeks of pregnancy unless there is a medical reason to do so.  When induction is needed for medical reasons, the benefits of induction generally outweigh the risks. This information is not intended to replace advice given to you by your health care provider. Make sure you discuss any questions you have with your health care provider. Document Released: 06/13/2006 Document Revised: 01/25/2017 Document Reviewed: 03/07/2016 Elsevier Patient Education  2020 [employer].

## 2018-11-07 NOTE — MAU Provider Note (Signed)
Chief Complaint:  Hypertension   First Provider Initiated Contact with Patient 11/07/18 0053      HPI: Valerie Crane is a 23 y.o. G1P0 at 34w4dwith known Gestational Hypertension and Diet controlled GDM, who presents to maternity admissions reporting severe range BP on machine at home.  Was told to come in and get checked.  Had reported LEFT rib pain, but got communicated as RUQ pain. .Marland Kitchenas had rib pain for a week or so. Hurts more with deep breath or touching it She reports good fetal movement, denies LOF, vaginal bleeding, vaginal itching/burning, urinary symptoms, h/a, dizziness, n/v, diarrhea, constipation or fever/chills.  She denies headache, visual changes or RUQ abdominal pain.  Scheduled for IOL in 3 days  RN note: PT SAYS  SHE WENT TO OFFICE  TODAY - WHEN  SHE LEFT OFFICE -PAIN  UNDER LEFT RIB -  SAME NOW AS WAS.   TOOK BP 168/98 AT 2230.    FEELS THAT RIGHT HAND IS  SWOLLEN  DENIES H/A , BLURRED VISION . TODAY BP IN OFFICE  GOOD .  NO UC'S.   Past Medical History: Past Medical History:  Diagnosis Date  . Medical history non-contributory     Past obstetric history: OB History  Gravida Para Term Preterm AB Living  1         0  SAB TAB Ectopic Multiple Live Births               # Outcome Date GA Lbr Len/2nd Weight Sex Delivery Anes PTL Lv  1 Current             Past Surgical History: Past Surgical History:  Procedure Laterality Date  . NO PAST SURGERIES      Family History: Family History  Problem Relation Age of Onset  . Diabetes Mother     Social History: Social History   Tobacco Use  . Smoking status: Former Smoker    Packs/day: 0.50    Types: Cigarettes    Quit date: 03/08/2018    Years since quitting: 0.6  . Smokeless tobacco: Never Used  Substance Use Topics  . Alcohol use: No  . Drug use: Not Currently    Types: Marijuana    Comment: none since March    Allergies: No Known Allergies  Meds:  Medications Prior to Admission  Medication Sig  Dispense Refill Last Dose  . Accu-Chek FastClix Lancets MISC 1 Units by Percutaneous route 4 (four) times daily. 100 each 12 11/07/2018 at Unknown time  . Blood Glucose Monitoring Suppl (ACCU-CHEK GUIDE) w/Device KIT 1 Device by Does not apply route 4 (four) times daily. 1 kit 0 11/07/2018 at Unknown time  . glucose blood (ACCU-CHEK GUIDE) test strip Use to check blood sugars four times a day was instructed 50 each 12 11/07/2018 at Unknown time  . Prenatal Vit-Fe Fumarate-FA (PRENATAL VITAMINS PO) Take by mouth.   11/06/2018 at Unknown time    I have reviewed patient's Past Medical Hx, Surgical Hx, Family Hx, Social Hx, medications and allergies.   ROS:  Review of Systems  Constitutional: Negative for chills and fever.  Eyes: Negative for visual disturbance.  Respiratory: Negative for shortness of breath.        Left rib pain near 11-12 rib  Cardiovascular: Positive for leg swelling.  Gastrointestinal: Negative for constipation, diarrhea and nausea.  Genitourinary: Negative for pelvic pain and vaginal bleeding.  Musculoskeletal: Negative for back pain.  Neurological: Negative for weakness.   Other systems  negative  Physical Exam   Patient Vitals for the past 24 hrs:  BP Temp Pulse Resp Height Weight  11/07/18 0030 133/73 98 F (36.7 C) 100 20 '5\' 8"'$  (1.727 m) 133.8 kg   Vitals:   11/07/18 0131 11/07/18 0145 11/07/18 0200 11/07/18 0230  BP: 131/79 130/76 133/76 133/78  Pulse: 87 89 81 84  Resp:      Temp:      Weight:      Height:        Constitutional: Well-developed, well-nourished female in no acute distress.  Cardiovascular: normal rate and rhythm Respiratory: normal effort, clear to auscultation bilaterally GI: Abd soft, non-tender, gravid appropriate for gestational age.   No rebound or guarding. MS: Extremities nontender, 2+ bilateral edema, normal ROM Neurologic: Alert and oriented x 4.   DTRs 3+ with no clonus GU: Neg CVAT.  PELVIC EXAM:  deferred  FHT:  Baseline  140 , moderate variability, accelerations present, no decelerations Contractions:  Irregular     Labs: Results for orders placed or performed during the hospital encounter of 11/07/18 (from the past 24 hour(s))  Protein / creatinine ratio, urine     Status: Abnormal   Collection Time: 11/07/18 12:33 AM  Result Value Ref Range   Creatinine, Urine 120.59 mg/dL   Total Protein, Urine 66 mg/dL   Protein Creatinine Ratio 0.55 (H) 0.00 - 0.15 mg/mg[Cre]  CBC     Status: Abnormal   Collection Time: 11/07/18 12:45 AM  Result Value Ref Range   WBC 9.8 4.0 - 10.5 K/uL   RBC 3.88 3.87 - 5.11 MIL/uL   Hemoglobin 11.8 (L) 12.0 - 15.0 g/dL   HCT 34.5 (L) 36.0 - 46.0 %   MCV 88.9 80.0 - 100.0 fL   MCH 30.4 26.0 - 34.0 pg   MCHC 34.2 30.0 - 36.0 g/dL   RDW 13.2 11.5 - 15.5 %   Platelets 253 150 - 400 K/uL   nRBC 0.0 0.0 - 0.2 %  Comprehensive metabolic panel     Status: Abnormal   Collection Time: 11/07/18 12:45 AM  Result Value Ref Range   Sodium 136 135 - 145 mmol/L   Potassium 4.0 3.5 - 5.1 mmol/L   Chloride 106 98 - 111 mmol/L   CO2 19 (L) 22 - 32 mmol/L   Glucose, Bld 96 70 - 99 mg/dL   BUN 11 6 - 20 mg/dL   Creatinine, Ser 0.64 0.44 - 1.00 mg/dL   Calcium 8.7 (L) 8.9 - 10.3 mg/dL   Total Protein 6.0 (L) 6.5 - 8.1 g/dL   Albumin 2.6 (L) 3.5 - 5.0 g/dL   AST 15 15 - 41 U/L   ALT 11 0 - 44 U/L   Alkaline Phosphatase 133 (H) 38 - 126 U/L   Total Bilirubin 0.2 (L) 0.3 - 1.2 mg/dL   GFR calc non Af Amer >60 >60 mL/min   GFR calc Af Amer >60 >60 mL/min   Anion gap 11 5 - 15    O/Positive/-- (04/02 1559)  Imaging:    MAU Course/MDM: I have ordered labs and reviewed results. Labs normal except for newly elevated Protein/Creat ratio NST reviewed, reactive   Treatments in MAU included EFM  Reviewed BP readings.  They are all within low range in 130s/80s.  No severe readings Labs normal except Pr/Cr Ratio is now elevated over previous value Discussed this means she has  preeclampsia but with no severe features.  Recommend precautions for worsening but plan on IOL  in 3 days.    Assessment: Single IUP at 72w4dPreeclampsia with no severe range blood pressure readings No signs of worsening Reactive Fetal heart rate tracing  Plan: Discharge home Labor precautions and fetal kick counts Keep appt for IOL in 3 days Encouraged to return here or to other Urgent Care/ED if she develops worsening of symptoms, increase in pain, fever, or other concerning symptoms.   Pt stable at time of discharge.  MHansel FeinsteinCNM, MSN Certified Nurse-Midwife 11/07/2018 12:53 AM

## 2018-11-07 NOTE — MAU Note (Signed)
PT SAYS  SHE WENT TO OFFICE  TODAY - WHEN  SHE LEFT OFFICE -PAIN  UNDER LEFT RIB -  SAME NOW AS WAS.   TOOK BP 168/98 AT 2230.    FEELS THAT RIGHT HAND IS  SWOLLEN  DENIES H/A , BLURRED VISION . TODAY BP IN OFFICE  GOOD .  NO UC'S.

## 2018-11-08 ENCOUNTER — Other Ambulatory Visit (HOSPITAL_COMMUNITY)
Admission: RE | Admit: 2018-11-08 | Discharge: 2018-11-08 | Disposition: A | Discharge status: Home / Self Care | Payer: BC Managed Care – PPO | Source: Ambulatory Visit | Attending: Obstetrics and Gynecology | Admitting: Obstetrics and Gynecology

## 2018-11-08 ENCOUNTER — Other Ambulatory Visit: Payer: Self-pay

## 2018-11-08 DIAGNOSIS — Z20828 Contact with and (suspected) exposure to other viral communicable diseases: Secondary | ICD-10-CM | POA: Diagnosis not present

## 2018-11-08 LAB — SARS CORONAVIRUS 2 (TAT 6-24 HRS): SARS Coronavirus 2: NEGATIVE

## 2018-11-08 LAB — STREP GP B NAA: Strep Gp B NAA: NEGATIVE

## 2018-11-08 NOTE — MAU Note (Signed)
Pt here for PAT covid swab. Denies symptoms. Swab collected.  

## 2018-11-09 ENCOUNTER — Other Ambulatory Visit: Payer: Self-pay | Admitting: Family Medicine

## 2018-11-09 NOTE — Progress Notes (Deleted)
LABOR AND DELIVERY ADMISSION HISTORY AND PHYSICAL NOTE  Valerie Crane is a 23 y.o. female G1P0 with IUP at [redacted]w[redacted]d by ultrasound presenting for IOL for Pre-eclampsia.  She reports positive fetal movement. She denies leakage of fluid or vaginal bleeding.  Prenatal History/Complications: PNC at [facility] Pregnancy complications:  - Persistent right umbilical vein on ultrasound, Fetal Echo at [facility] 08/19/2018 WNL - A1GDM  - gHTN with development of Preeclampsia  - Large for gestational age - measured 29 (95%) at [redacted]w[redacted]d - excessive weight gain during 3rd trimester - Obesity  Past Medical History: Past Medical History:  Diagnosis Date  . Medical history non-contributory     Past Surgical History: Past Surgical History:  Procedure Laterality Date  . NO PAST SURGERIES      Obstetrical History: OB History    Gravida  1   Para      Term      Preterm      AB      Living  0     SAB      TAB      Ectopic      Multiple      Live Births              Social History: Social History   Socioeconomic History  . Marital status: Single    Spouse name: Not on file  . Number of children: Not on file  . Years of education: Not on file  . Highest education level: Not on file  Occupational History  . Not on file  Social Needs  . Financial resource strain: Not hard at all  . Food insecurity    Worry: Never true    Inability: Never true  . Transportation needs    Medical: No    Non-medical: No  Tobacco Use  . Smoking status: Former Smoker    Packs/day: 0.50    Types: Cigarettes    Quit date: 03/08/2018    Years since quitting: 0.6  . Smokeless tobacco: Never Used  Substance and Sexual Activity  . Alcohol use: No  . Drug use: Not Currently    Types: Marijuana    Comment: none since March  . Sexual activity: Not Currently    Birth control/protection: None  Lifestyle  . Physical activity    Days per week: Not on file    Minutes per session: Not on file  .  Stress: Not on file  Relationships  . Social Musician on phone: Not on file    Gets together: Not on file    Attends religious service: Not on file    Active member of club or organization: Not on file    Attends meetings of clubs or organizations: Not on file    Relationship status: Not on file  Other Topics Concern  . Not on file  Social History Narrative  . Not on file    Family History: Family History  Problem Relation Age of Onset  . Diabetes Mother     Allergies: No Known Allergies  (Not in a hospital admission)    Review of Systems  All systems reviewed and negative except as stated in HPI  Physical Exam Last menstrual period 02/24/2018. General appearance: alert, oriented, NAD*** Lungs: normal respiratory effort Heart: regular rate Abdomen: soft, non-tender; gravid, FH appropriate for GA Extremities: No calf swelling or tenderness Presentation: cephalic*** Fetal monitoring: *** Uterine activity: ***    Prenatal labs: ABO, Rh: O/Positive/-- (04/02  1559) Antibody: Negative (04/02 1559) Rubella: 1.34 (04/02 1559) RPR: Non Reactive (08/13 0857)  HBsAg: Negative (04/02 1559)  HIV: Non Reactive (08/13 0857)  GC/Chlamydia: *** GBS: --Henderson Cloud (10/01 1446)  2-hr GTT: *** Genetic screening:  Quad WNL  Anatomy US: Persistent right umbilical vein   Prenatal Transfer Tool  Maternal Diabetes: Yes:  Diabetes Type:  Diet controlled Genetic Screening: Normal Maternal Ultrasounds/Referrals: Normal Fetal Ultrasounds or other Referrals:  {Fetal Ultrasounds or Other Referrals:20213} Maternal Substance Abuse:  {Maternal Substance Abuse:20223} Significant Maternal Medications:  {Significant Maternal Meds:20233} Significant Maternal Lab Results: {Significant Maternal Lab Results:20235}  No results found for this or any previous visit (from the past 24 hour(s)).  Patient Active Problem List   Diagnosis Date Noted  . LGA (large for gestational age)  fetus affecting management of mother, third trimester 10/15/2018  . Preeclampsia, third trimester 10/14/2018  . Maternal morbid obesity, antepartum ([place]) 10/09/2018  . Diet controlled gestational diabetes mellitus in third trimester 09/24/2018  . Trichomonal vaginitis during pregnancy in first trimester 06/26/2018  . Excessive weight gain during pregnancy in third trimester 05/12/2018  . Supervision of high-risk pregnancy 05/08/2018    Assessment: Valerie Crane is a 23 y.o. G1P0 at [redacted]w[redacted]d here for IOL for Preeclampsia.  #Labor: *** #Pain: *** #FWB: *** #ID:  GBS negative  Preeclampsia:  Postpartum Planning: - Boy / *** / *** - Circumcision: - [x]  TDAP, [Declined] Flu  Annalyce Lanpher P Zilah Villaflor 11/09/2018, 10:24 PM

## 2018-11-10 ENCOUNTER — Other Ambulatory Visit: Payer: Self-pay

## 2018-11-10 ENCOUNTER — Encounter (HOSPITAL_COMMUNITY): Payer: Self-pay | Admitting: *Deleted

## 2018-11-10 ENCOUNTER — Inpatient Hospital Stay (HOSPITAL_COMMUNITY): Payer: BC Managed Care – PPO

## 2018-11-10 ENCOUNTER — Inpatient Hospital Stay (HOSPITAL_COMMUNITY)
Admission: AD | Admit: 2018-11-10 | Discharge: 2018-11-16 | DRG: 788 | Disposition: A | Discharge status: Home / Self Care | Payer: BC Managed Care – PPO | Attending: Family Medicine | Admitting: Family Medicine

## 2018-11-10 DIAGNOSIS — O1410 Severe pre-eclampsia, unspecified trimester: Secondary | ICD-10-CM | POA: Diagnosis not present

## 2018-11-10 DIAGNOSIS — O1414 Severe pre-eclampsia complicating childbirth: Secondary | ICD-10-CM | POA: Diagnosis not present

## 2018-11-10 DIAGNOSIS — Z23 Encounter for immunization: Secondary | ICD-10-CM | POA: Diagnosis not present

## 2018-11-10 DIAGNOSIS — O99214 Obesity complicating childbirth: Secondary | ICD-10-CM | POA: Diagnosis present

## 2018-11-10 DIAGNOSIS — Z3A36 36 weeks gestation of pregnancy: Secondary | ICD-10-CM | POA: Diagnosis not present

## 2018-11-10 DIAGNOSIS — O24420 Gestational diabetes mellitus in childbirth, diet controlled: Secondary | ICD-10-CM | POA: Diagnosis present

## 2018-11-10 DIAGNOSIS — O24410 Gestational diabetes mellitus in pregnancy, diet controlled: Secondary | ICD-10-CM | POA: Diagnosis present

## 2018-11-10 DIAGNOSIS — O1413 Severe pre-eclampsia, third trimester: Secondary | ICD-10-CM | POA: Diagnosis not present

## 2018-11-10 DIAGNOSIS — O133 Gestational [pregnancy-induced] hypertension without significant proteinuria, third trimester: Secondary | ICD-10-CM | POA: Diagnosis present

## 2018-11-10 DIAGNOSIS — Z3A37 37 weeks gestation of pregnancy: Secondary | ICD-10-CM

## 2018-11-10 DIAGNOSIS — Z87891 Personal history of nicotine dependence: Secondary | ICD-10-CM | POA: Diagnosis not present

## 2018-11-10 DIAGNOSIS — O3663X0 Maternal care for excessive fetal growth, third trimester, not applicable or unspecified: Secondary | ICD-10-CM | POA: Diagnosis present

## 2018-11-10 LAB — COMPREHENSIVE METABOLIC PANEL
ALT: 10 U/L (ref 0–44)
AST: 22 U/L (ref 15–41)
Albumin: 2.5 g/dL — ABNORMAL LOW (ref 3.5–5.0)
Alkaline Phosphatase: 140 U/L — ABNORMAL HIGH (ref 38–126)
Anion gap: 13 (ref 5–15)
BUN: 9 mg/dL (ref 6–20)
CO2: 16 mmol/L — ABNORMAL LOW (ref 22–32)
Calcium: 8.8 mg/dL — ABNORMAL LOW (ref 8.9–10.3)
Chloride: 108 mmol/L (ref 98–111)
Creatinine, Ser: 0.72 mg/dL (ref 0.44–1.00)
GFR calc Af Amer: >60 mL/min (ref >60)
GFR calc non Af Amer: >60 mL/min (ref >60)
Glucose, Bld: 133 mg/dL — ABNORMAL HIGH (ref 70–99)
Potassium: 4.3 mmol/L (ref 3.5–5.1)
Sodium: 137 mmol/L (ref 135–145)
Total Bilirubin: 1 mg/dL (ref 0.3–1.2)
Total Protein: 5.5 g/dL — ABNORMAL LOW (ref 6.5–8.1)

## 2018-11-10 LAB — ABO/RH: ABO/RH(D): O POS

## 2018-11-10 LAB — CBC
HCT: 34.5 % — ABNORMAL LOW (ref 36.0–46.0)
Hemoglobin: 11.2 g/dL — ABNORMAL LOW (ref 12.0–15.0)
MCH: 29.7 pg (ref 26.0–34.0)
MCHC: 32.5 g/dL (ref 30.0–36.0)
MCV: 91.5 fL (ref 80.0–100.0)
Platelets: 226 10*3/uL (ref 150–400)
RBC: 3.77 MIL/uL — ABNORMAL LOW (ref 3.87–5.11)
RDW: 13.3 % (ref 11.5–15.5)
WBC: 10.5 10*3/uL (ref 4.0–10.5)
nRBC: 0 % (ref 0.0–0.2)

## 2018-11-10 LAB — GLUCOSE, CAPILLARY
Glucose-Capillary: 132 mg/dL — ABNORMAL HIGH (ref 70–99)
Glucose-Capillary: 83 mg/dL (ref 70–99)
Glucose-Capillary: 96 mg/dL (ref 70–99)
Glucose-Capillary: 88 mg/dL (ref 70–99)

## 2018-11-10 LAB — TYPE AND SCREEN
ABO/RH(D): O POS
Antibody Screen: NEGATIVE

## 2018-11-10 MED ORDER — DIPHENHYDRAMINE HCL 50 MG/ML IJ SOLN: 12.5000 mg | INTRAMUSCULAR | Status: DC | PRN

## 2018-11-10 MED ORDER — FENTANYL-BUPIVACAINE-NACL 0.5-0.125-0.9 MG/250ML-% EP SOLN
12.0000 mL/h | EPIDURAL | Status: DC | PRN
  Filled 2018-11-10 (×2): qty 250

## 2018-11-10 MED ORDER — MAGNESIUM SULFATE 40 G IN LACTATED RINGERS - SIMPLE
2.0000 g/h | INTRAVENOUS | Status: DC
  Administered 2018-11-10: 2 g/h via INTRAVENOUS
  Filled 2018-11-10: qty 500

## 2018-11-10 MED ORDER — LIDOCAINE HCL (PF) 1 % IJ SOLN: 30.0000 mL | INTRAMUSCULAR | Status: DC | PRN

## 2018-11-10 MED ORDER — OXYTOCIN 40 UNITS IN NORMAL SALINE INFUSION - SIMPLE MED: 2.5000 [IU]/h | INTRAVENOUS | Status: DC

## 2018-11-10 MED ORDER — EPHEDRINE 5 MG/ML INJ: 10.0000 mg | INTRAVENOUS | Status: DC | PRN

## 2018-11-10 MED ORDER — FLEET ENEMA 7-19 GM/118ML RE ENEM: 1.0000 | ENEMA | Freq: Every day | RECTAL | Status: DC | PRN

## 2018-11-10 MED ORDER — LABETALOL HCL 5 MG/ML IV SOLN: 40.0000 mg | INTRAVENOUS | Status: DC | PRN

## 2018-11-10 MED ORDER — MISOPROSTOL 50MCG HALF TABLET
50.0000 ug | ORAL_TABLET | ORAL | Status: DC
  Administered 2018-11-10 (×2): 50 ug via ORAL
  Filled 2018-11-10 (×2): qty 1

## 2018-11-10 MED ORDER — FENTANYL CITRATE (PF) 100 MCG/2ML IJ SOLN
50.0000 ug | INTRAMUSCULAR | Status: DC | PRN
  Administered 2018-11-11 – 2018-11-12 (×4): 100 ug via INTRAVENOUS
  Filled 2018-11-10 (×5): qty 2

## 2018-11-10 MED ORDER — ZOLPIDEM TARTRATE 5 MG PO TABS
5.0000 mg | ORAL_TABLET | Freq: Every evening | ORAL | Status: DC | PRN
  Administered 2018-11-11: 5 mg via ORAL
  Filled 2018-11-10: qty 1

## 2018-11-10 MED ORDER — LACTATED RINGERS IV SOLN
500.0000 mL | Freq: Once | INTRAVENOUS | Status: AC
  Administered 2018-11-12: 20:00:00 500 mL via INTRAVENOUS

## 2018-11-10 MED ORDER — TERBUTALINE SULFATE 1 MG/ML IJ SOLN: 0.2500 mg | Freq: Once | INTRAMUSCULAR | Status: DC | PRN

## 2018-11-10 MED ORDER — ONDANSETRON HCL 4 MG/2ML IJ SOLN: 4.0000 mg | Freq: Four times a day (QID) | INTRAMUSCULAR | Status: DC | PRN

## 2018-11-10 MED ORDER — PHENYLEPHRINE 40 MCG/ML (10ML) SYRINGE FOR IV PUSH (FOR BLOOD PRESSURE SUPPORT): 80.0000 ug | PREFILLED_SYRINGE | INTRAVENOUS | Status: DC | PRN

## 2018-11-10 MED ORDER — MISOPROSTOL 25 MCG QUARTER TABLET
25.0000 ug | ORAL_TABLET | ORAL | Status: DC | PRN
  Administered 2018-11-10 – 2018-11-11 (×3): 25 ug via VAGINAL
  Filled 2018-11-10 (×3): qty 1

## 2018-11-10 MED ORDER — LACTATED RINGERS IV SOLN
INTRAVENOUS | Status: DC
  Administered 2018-11-10 – 2018-11-12 (×5): via INTRAVENOUS

## 2018-11-10 MED ORDER — OXYTOCIN 40 UNITS IN NORMAL SALINE INFUSION - SIMPLE MED
1.0000 m[IU]/min | INTRAVENOUS | Status: DC
  Filled 2018-11-10: qty 1000

## 2018-11-10 MED ORDER — OXYTOCIN BOLUS FROM INFUSION: 500.0000 mL | Freq: Once | INTRAVENOUS | Status: DC

## 2018-11-10 MED ORDER — OXYCODONE-ACETAMINOPHEN 5-325 MG PO TABS: 2.0000 | ORAL_TABLET | ORAL | Status: DC | PRN

## 2018-11-10 MED ORDER — LACTATED RINGERS IV SOLN: 500.0000 mL | INTRAVENOUS | Status: DC | PRN

## 2018-11-10 MED ORDER — MAGNESIUM SULFATE BOLUS VIA INFUSION
4.0000 g | Freq: Once | INTRAVENOUS | Status: AC
  Administered 2018-11-10: 23:00:00 4 g via INTRAVENOUS
  Filled 2018-11-10: qty 500

## 2018-11-10 MED ORDER — ACETAMINOPHEN 325 MG PO TABS
650.0000 mg | ORAL_TABLET | ORAL | Status: DC | PRN
  Administered 2018-11-11 – 2018-11-12 (×3): 650 mg via ORAL
  Filled 2018-11-10 (×3): qty 2

## 2018-11-10 MED ORDER — LABETALOL HCL 5 MG/ML IV SOLN: 20.0000 mg | INTRAVENOUS | Status: DC | PRN

## 2018-11-10 MED ORDER — LABETALOL HCL 5 MG/ML IV SOLN: 80.0000 mg | INTRAVENOUS | Status: DC | PRN

## 2018-11-10 MED ORDER — HYDROXYZINE HCL 50 MG PO TABS: 50.0000 mg | ORAL_TABLET | Freq: Four times a day (QID) | ORAL | Status: DC | PRN

## 2018-11-10 MED ORDER — SOD CITRATE-CITRIC ACID 500-334 MG/5ML PO SOLN
30.0000 mL | ORAL | Status: DC | PRN
  Administered 2018-11-13: 30 mL via ORAL
  Filled 2018-11-10: qty 30

## 2018-11-10 MED ORDER — HYDRALAZINE HCL 20 MG/ML IJ SOLN: 10.0000 mg | INTRAMUSCULAR | Status: DC | PRN

## 2018-11-10 MED ORDER — OXYCODONE-ACETAMINOPHEN 5-325 MG PO TABS: 1.0000 | ORAL_TABLET | ORAL | Status: DC | PRN

## 2018-11-10 MED ORDER — LABETALOL HCL 200 MG PO TABS
200.0000 mg | ORAL_TABLET | Freq: Two times a day (BID) | ORAL | Status: DC
  Administered 2018-11-10 – 2018-11-12 (×3): 200 mg via ORAL
  Filled 2018-11-10 (×3): qty 1

## 2018-11-10 NOTE — Progress Notes (Signed)
LABOR PROGRESS NOTE  Valerie Crane is a 23 y.o. G1P0 at [redacted]w[redacted]d  admitted for IOL for pre-eclampsia  Subjective: Doing well. Denies any complaints including headaches, chest pain, vision changes.  Objective: BP (!) 157/98   Pulse 81   Temp 98.6 F (37 C) (Oral)   Resp 18   Ht 5\' 8"  (1.727 m)   Wt 134 kg   LMP 02/24/2018 (Exact Date)   BMI 44.92 kg/m  or  Vitals:   11/10/18 2102 11/10/18 2117 11/10/18 2132 11/10/18 2146  BP: (!) 160/96 (!) 156/104 (!) 162/95 (!) 157/98  Pulse: 76 78 82 81  Resp: 18 18 18 18   Temp:      TempSrc:      Weight:      Height:       Dilation: Closed Effacement (%): 20 Cervical Position: Posterior Station: -3 Presentation: Vertex Exam by:: cresenzo,v FHT: baseline rate 135, moderate varibility, acel, few early decel Toco: Variable  Labs: Lab Results  Component Value Date   WBC 10.5 11/10/2018   HGB 11.2 (L) 11/10/2018   HCT 34.5 (L) 11/10/2018   MCV 91.5 11/10/2018   PLT 226 11/10/2018    Patient Active Problem List   Diagnosis Date Noted  . Gestational hypertension, third trimester 11/10/2018  . LGA (large for gestational age) fetus affecting management of mother, third trimester 10/15/2018  . Preeclampsia, third trimester 10/14/2018  . Maternal morbid obesity, antepartum ([place]) 10/09/2018  . Diet controlled gestational diabetes mellitus in third trimester 09/24/2018  . Trichomonal vaginitis during pregnancy in first trimester 06/26/2018  . Excessive weight gain during pregnancy in third trimester 05/12/2018  . Supervision of high-risk pregnancy 05/08/2018    Assessment / Plan: 23 y.o. G1P0 at [redacted]w[redacted]d here for IOL for Pre-Eclampsia.  Labor: S/p Cytotec at 42. Will reevaluate at 4 hours and consider FB vs another cytotec Fetal Wellbeing:  Cat I Pain Control:  Epidural upon request Anticipated MOD:  NSVD  Pre-Eclampsia with Severe features: Blood pressures continues to remain elevated. Two severe blood pressures >160/90 at  around 2100.  - start Mag 4g bolus + 2g/hr - Labetalol 200mg  BID - continue to monitor blood pressures  Mina Marble, D.O. [place], PGY2 11/10/2018, 9:59 PM

## 2018-11-10 NOTE — Progress Notes (Signed)
LABOR PROGRESS NOTE  Valerie Crane is a 23 y.o. G1P0 at [redacted]w[redacted]d  admitted for duction of labor due to preeclampsia.  Subjective: Patient reports she is feeling uncomfortable and feels like baby is now down.  Patient is feeling more contractions.  Is otherwise doing well.  We attempted to place a Foley bulb.  Cervix felt open but was unable to guide Foley bulb through.  Objective: BP (!) 154/88   Pulse 80   Temp 98.6 F (37 C) (Oral)   Resp 18   Ht 5\' 8"  (1.727 m)   Wt 134 kg   LMP 02/24/2018 (Exact Date)   BMI 44.92 kg/m  or  Vitals:   11/10/18 1602 11/10/18 1702 11/10/18 1809 11/10/18 1923  BP: (!) 91/46 128/86 136/66 (!) 154/88  Pulse: 88 78 84 80  Resp:  16 16 18   Temp:    98.6 F (37 C)  TempSrc:    Oral  Weight:      Height:        Dilation: Closed Effacement (%): 20 Cervical Position: Posterior Station: -3 Presentation: Vertex Exam by:: Kyuss Hale,v FHT: baseline rate 120, moderate varibility, pos acel, neg decel Toco: Contractions every 4 to 6 minutes  Labs: Lab Results  Component Value Date   WBC 10.5 11/10/2018   HGB 11.2 (L) 11/10/2018   HCT 34.5 (L) 11/10/2018   MCV 91.5 11/10/2018   PLT 226 11/10/2018    Patient Active Problem List   Diagnosis Date Noted  . Gestational hypertension, third trimester 11/10/2018  . LGA (large for gestational age) fetus affecting management of mother, third trimester 10/15/2018  . Preeclampsia, third trimester 10/14/2018  . Maternal morbid obesity, antepartum ([place]) 10/09/2018  . Diet controlled gestational diabetes mellitus in third trimester 09/24/2018  . Trichomonal vaginitis during pregnancy in first trimester 06/26/2018  . Excessive weight gain during pregnancy in third trimester 05/12/2018  . Supervision of high-risk pregnancy 05/08/2018    Assessment / Plan: 23 y.o. G1P0 at [redacted]w[redacted]d here for duction of labor due to preeclampsia.  Labor: Progressing.  Cytotec x3, last dose at 1919 Fetal Wellbeing: Category 1,  reassuring fetal heart tracing Pain Control: Epidural when requested Anticipated MOD: Vaginal delivery  Gifford Shave, MD  PGY-1, [facility]  11/10/2018, 7:45 PM

## 2018-11-10 NOTE — Progress Notes (Addendum)
LABOR PROGRESS NOTE  Valerie Crane is a 23 y.o. G1P0 at [redacted]w[redacted]d  admitted for induction of labor due to preeclampsia.  Subjective: Patient is doing well.  Reports she can feel occasional contractions.  Is in no pain at this time.  Objective: BP (!) 152/93   Pulse 81   Temp 98.5 F (36.9 C) (Oral)   Resp 18   LMP 02/24/2018 (Exact Date)  or  Vitals:   11/10/18 1101 11/10/18 1132 11/10/18 1206 11/10/18 1301  BP: 133/71 (!) 149/90 (!) 144/91 (!) 152/93  Pulse: 78 75 75 81  Resp:      Temp:      TempSrc:        General: Alert, well-appearing Dilation: Closed Effacement (%): 20 Cervical Position: Posterior Station: -3 Presentation: Vertex Exam by:: Arabel Barcenas,v FHT: baseline rate 120, moderate varibility, positive acel, no decel Toco: irregular every 5-10 min   Labs: Lab Results  Component Value Date   WBC 10.5 11/10/2018   HGB 11.2 (L) 11/10/2018   HCT 34.5 (L) 11/10/2018   MCV 91.5 11/10/2018   PLT 226 11/10/2018    Patient Active Problem List   Diagnosis Date Noted  . Gestational hypertension, third trimester 11/10/2018  . LGA (large for gestational age) fetus affecting management of mother, third trimester 10/15/2018  . Preeclampsia, third trimester 10/14/2018  . Maternal morbid obesity, antepartum ([place]) 10/09/2018  . Diet controlled gestational diabetes mellitus in third trimester 09/24/2018  . Trichomonal vaginitis during pregnancy in first trimester 06/26/2018  . Excessive weight gain during pregnancy in third trimester 05/12/2018  . Supervision of high-risk pregnancy 05/08/2018    Assessment / Plan: 23 y.o. G1P0 at [redacted]w[redacted]d here for induction of labor for preeclampsia   Labor: Feeling mild contractions at this time, Cytotec x2 with dose #2 at 1325.  Will reassess in 4 hours and consider Foley bulb Fetal Wellbeing: Category 1, reassuring Pain Control: Epidural upon request Anticipated MOD: Vaginal delivery  Gifford Shave, MD  PGY-1, [facility]   11/10/2018, 1:27 PM

## 2018-11-10 NOTE — H&P (Addendum)
LABOR AND DELIVERY ADMISSION HISTORY AND PHYSICAL NOTE  Valerie Crane is a 23 y.o. female G1P0 with IUP at 72w6dby ultrasound presenting for IOL for Pre-eclampsia.  She reports positive fetal movement. She denies leakage of fluid or vaginal bleeding.  Patient reports that she did notice some left lower quadrant pain 2 days ago at her previous visit.  Was told it was most likely round ligament pain.  Pain has resolved.  Patient also reports that she has noticed increased urgency with urination but feels like she is not urinating a large amount.  Prenatal History/Complications: PNC at [facility] Pregnancy complications:  - Persistent right umbilical vein on ultrasound[facility] WNL - A1GDM  - gHTN with development of Preeclampsia  - Large for gestational age - measured 210(95%) at 344w3d excessive weight gain during 3rd trimester - Obesity  Past Medical History: Past Medical History:  Diagnosis Date  . Medical history non-contributory     Past Surgical History: Past Surgical History:  Procedure Laterality Date  . NO PAST SURGERIES      Obstetrical History: OB History    Gravida  1   Para      Term      Preterm      AB      Living  0     SAB      TAB      Ectopic      Multiple      Live Births              Social History: Social History   Socioeconomic History  . Marital status: Single    Spouse name: Not on file  . Number of children: Not on file  . Years of education: Not on file  . Highest education level: Not on file  Occupational History  . Not on file  Social Needs  . Financial resource strain: Not hard at all  . Food insecurity    Worry: Never true    Inability: Never true  . Transportation needs    Medical: No    Non-medical: No  Tobacco Use  . Smoking status: Former Smoker    Packs/day: 0.50    Types: Cigarettes    Quit date: 03/08/2018    Years since quitting: 0.6  . Smokeless tobacco: Never Used  Substance  and Sexual Activity  . Alcohol use: No  . Drug use: Not Currently    Types: Marijuana    Comment: none since March  . Sexual activity: Not Currently    Birth control/protection: None  Lifestyle  . Physical activity    Days per week: Not on file    Minutes per session: Not on file  . Stress: Not on file  Relationships  . Social coHerbalistn phone: Not on file    Gets together: Not on file    Attends religious service: Not on file    Active member of club or organization: Not on file    Attends meetings of clubs or organizations: Not on file    Relationship status: Not on file  Other Topics Concern  . Not on file  Social History Narrative  . Not on file    Family History: Family History  Problem Relation Age of Onset  . Diabetes Mother     Allergies: No Known Allergies  Medications Prior to Admission  Medication Sig Dispense Refill Last Dose  . Accu-Chek FastClix Lancets MISC  1 Units by Percutaneous route 4 (four) times daily. 100 each 12   . Blood Glucose Monitoring Suppl (ACCU-CHEK GUIDE) w/Device KIT 1 Device by Does not apply route 4 (four) times daily. 1 kit 0   . glucose blood (ACCU-CHEK GUIDE) test strip Use to check blood sugars four times a day was instructed 50 each 12   . Prenatal Vit-Fe Fumarate-FA (PRENATAL VITAMINS PO) Take by mouth.        Review of Systems  All systems reviewed and negative except as stated in HPI  Physical Exam Last menstrual period 02/24/2018. General appearance: alert, oriented, NAD Lungs: normal respiratory effort Heart: regular rate Abdomen: soft, non-tender; gravid, FH appropriate for GA Extremities: No calf swelling or tenderness, trace lower extremity pitting edema Presentation: cephalic Fetal monitoring: Category 1 baseline fetal heart rate around 120 with a cells and few variable decelerations Uterine activity: Irregular contractions    Prenatal labs: ABO, Rh: O/Positive/-- (04/02 1559) Antibody: Negative  (04/02 1559) Rubella: 1.34 (04/02 1559) RPR: Non Reactive (08/13 0857)  HBsAg: Negative (04/02 1559)  HIV: Non Reactive (08/13 0857)  GC/Chlamydia: Negative 11/06/2018 GBS: --Henderson Cloud (10/01 1446)  2-hr GTT: Fasting- 97, 1 hour- 200, 2 hour- 142  Genetic screening:  Quad WNL  Anatomy US: Persistent right umbilical vein    Prenatal Transfer Tool  Maternal Diabetes: Yes:  Diabetes Type:  Diet controlled Genetic Screening: Normal Maternal Ultrasounds/Referrals: Normal Fetal Ultrasounds or other Referrals:  Referred to Materal Fetal Medicine  [facility] 10/15/2018 2713 g  Maternal Substance Abuse:  No Significant Maternal Medications:  None Significant Maternal Lab Results: None  Results for orders placed or performed during the hospital encounter of 11/10/18 (from the past 24 hour(s))  CBC   Collection Time: 11/10/18  8:44 AM  Result Value Ref Range   WBC 10.5 4.0 - 10.5 K/uL   RBC 3.77 (L) 3.87 - 5.11 MIL/uL   Hemoglobin 11.2 (L) 12.0 - 15.0 g/dL   HCT 34.5 (L) 36.0 - 46.0 %   MCV 91.5 80.0 - 100.0 fL   MCH 29.7 26.0 - 34.0 pg   MCHC 32.5 30.0 - 36.0 g/dL   RDW 13.3 11.5 - 15.5 %   Platelets 226 150 - 400 K/uL   nRBC 0.0 0.0 - 0.2 %  Glucose, capillary   Collection Time: 11/10/18  8:52 AM  Result Value Ref Range   Glucose-Capillary 132 (H) 70 - 99 mg/dL    Patient Active Problem List   Diagnosis Date Noted  . Gestational hypertension, third trimester 11/10/2018  . LGA (large for gestational age) fetus affecting management of mother, third trimester 10/15/2018  . Preeclampsia, third trimester 10/14/2018  . Maternal morbid obesity, antepartum ([place]) 10/09/2018  . Diet controlled gestational diabetes mellitus in third trimester 09/24/2018  . Trichomonal vaginitis during pregnancy in first trimester 06/26/2018  . Excessive weight gain during pregnancy in third trimester 05/12/2018  . Supervision of high-risk pregnancy 05/08/2018    Assessment: Valerie Crane is a 23 y.o. G1P0  at 76w6dhere for IOL for Preeclampsia.  #Labor: Not feeling contractions at this time.  Cytotec 25 mcg placed at 0857.  Will reassess in 4 hours #Pain: None at this time but planning on epidural #FWB: Category 1, reassuring #ID:  GBS negative  Preeclampsia: Patient's blood pressures have been mildly hypertensive since admission in the 140s-150s/80s-90s.  Patient denies any headaches, changes in vision, increased urination. - Labetalol protocol   Postpartum Planning: - Girl/ Bottle /Undecided  - Circumcision: - [  x] TDAP, [Declined] Flu  Gifford Shave 11/10/2018, 9:10 AM   I confirm that I have verified the information documented in the resident's note and that I have also personally reperformed the history, physical exam and all medical decision making activities of this service and have verified that all service and findings are accurately documented in this student's note.   -Will order CBGs every 4 hrs for GDM. -Okay for light labor diet with diabetic restrictions.  -Will reassess as needed.   Gavin Pound, CNM 11/10/2018 12:54 PM

## 2018-11-10 NOTE — Progress Notes (Deleted)
LABOR AND DELIVERY ADMISSION HISTORY AND PHYSICAL NOTE  Valerie Crane is a 23 y.o. female G1P0 with IUP at 72w6dby ultrasound presenting for IOL for Pre-eclampsia.  She reports positive fetal movement. She denies leakage of fluid or vaginal bleeding.  Patient reports that she did notice some left lower quadrant pain 2 days ago at her previous visit.  Was told it was most likely round ligament pain.  Pain has resolved.  Patient also reports that she has noticed increased urgency with urination but feels like she is not urinating a large amount.  Prenatal History/Complications: PNC at [facility] Pregnancy complications:  - Persistent right umbilical vein on ultrasound[facility] WNL - A1GDM  - gHTN with development of Preeclampsia  - Large for gestational age - measured 210(95%) at 344w3d excessive weight gain during 3rd trimester - Obesity  Past Medical History: Past Medical History:  Diagnosis Date  . Medical history non-contributory     Past Surgical History: Past Surgical History:  Procedure Laterality Date  . NO PAST SURGERIES      Obstetrical History: OB History    Gravida  1   Para      Term      Preterm      AB      Living  0     SAB      TAB      Ectopic      Multiple      Live Births              Social History: Social History   Socioeconomic History  . Marital status: Single    Spouse name: Not on file  . Number of children: Not on file  . Years of education: Not on file  . Highest education level: Not on file  Occupational History  . Not on file  Social Needs  . Financial resource strain: Not hard at all  . Food insecurity    Worry: Never true    Inability: Never true  . Transportation needs    Medical: No    Non-medical: No  Tobacco Use  . Smoking status: Former Smoker    Packs/day: 0.50    Types: Cigarettes    Quit date: 03/08/2018    Years since quitting: 0.6  . Smokeless tobacco: Never Used  Substance  and Sexual Activity  . Alcohol use: No  . Drug use: Not Currently    Types: Marijuana    Comment: none since March  . Sexual activity: Not Currently    Birth control/protection: None  Lifestyle  . Physical activity    Days per week: Not on file    Minutes per session: Not on file  . Stress: Not on file  Relationships  . Social coHerbalistn phone: Not on file    Gets together: Not on file    Attends religious service: Not on file    Active member of club or organization: Not on file    Attends meetings of clubs or organizations: Not on file    Relationship status: Not on file  Other Topics Concern  . Not on file  Social History Narrative  . Not on file    Family History: Family History  Problem Relation Age of Onset  . Diabetes Mother     Allergies: No Known Allergies  Medications Prior to Admission  Medication Sig Dispense Refill Last Dose  . Accu-Chek FastClix Lancets MISC  1 Units by Percutaneous route 4 (four) times daily. 100 each 12   . Blood Glucose Monitoring Suppl (ACCU-CHEK GUIDE) w/Device KIT 1 Device by Does not apply route 4 (four) times daily. 1 kit 0   . glucose blood (ACCU-CHEK GUIDE) test strip Use to check blood sugars four times a day was instructed 50 each 12   . Prenatal Vit-Fe Fumarate-FA (PRENATAL VITAMINS PO) Take by mouth.        Review of Systems  All systems reviewed and negative except as stated in HPI  Physical Exam Last menstrual period 02/24/2018. General appearance: alert, oriented, NAD Lungs: normal respiratory effort Heart: regular rate Abdomen: soft, non-tender; gravid, FH appropriate for GA Extremities: No calf swelling or tenderness, trace lower extremity pitting edema Presentation: cephalic Fetal monitoring: Category 1 baseline fetal heart rate around 120 with a cells and few variable decelerations Uterine activity: Irregular contractions    Prenatal labs: ABO, Rh: O/Positive/-- (04/02 1559) Antibody: Negative  (04/02 1559) Rubella: 1.34 (04/02 1559) RPR: Non Reactive (08/13 0857)  HBsAg: Negative (04/02 1559)  HIV: Non Reactive (08/13 0857)  GC/Chlamydia: Negative 11/06/2018 GBS: --Henderson Cloud (10/01 1446)  2-hr GTT: Fasting- 97, 1 hour- 200, 2 hour- 142  Genetic screening:  Quad WNL  Anatomy US: Persistent right umbilical vein    Prenatal Transfer Tool  Maternal Diabetes: Yes:  Diabetes Type:  Diet controlled Genetic Screening: Normal Maternal Ultrasounds/Referrals: Normal Fetal Ultrasounds or other Referrals:  Referred to Materal Fetal Medicine  [facility] 10/15/2018 2713 g  Maternal Substance Abuse:  No Significant Maternal Medications:  None Significant Maternal Lab Results: None  Results for orders placed or performed during the hospital encounter of 11/10/18 (from the past 24 hour(s))  CBC   Collection Time: 11/10/18  8:44 AM  Result Value Ref Range   WBC 10.5 4.0 - 10.5 K/uL   RBC 3.77 (L) 3.87 - 5.11 MIL/uL   Hemoglobin 11.2 (L) 12.0 - 15.0 g/dL   HCT 34.5 (L) 36.0 - 46.0 %   MCV 91.5 80.0 - 100.0 fL   MCH 29.7 26.0 - 34.0 pg   MCHC 32.5 30.0 - 36.0 g/dL   RDW 13.3 11.5 - 15.5 %   Platelets 226 150 - 400 K/uL   nRBC 0.0 0.0 - 0.2 %  Glucose, capillary   Collection Time: 11/10/18  8:52 AM  Result Value Ref Range   Glucose-Capillary 132 (H) 70 - 99 mg/dL    Patient Active Problem List   Diagnosis Date Noted  . Gestational hypertension, third trimester 11/10/2018  . LGA (large for gestational age) fetus affecting management of mother, third trimester 10/15/2018  . Preeclampsia, third trimester 10/14/2018  . Maternal morbid obesity, antepartum ([place]) 10/09/2018  . Diet controlled gestational diabetes mellitus in third trimester 09/24/2018  . Trichomonal vaginitis during pregnancy in first trimester 06/26/2018  . Excessive weight gain during pregnancy in third trimester 05/12/2018  . Supervision of high-risk pregnancy 05/08/2018    Assessment: Valerie Crane is a 23 y.o. G1P0  at 29w6dhere for IOL for Preeclampsia.  #Labor: Not feeling contractions at this time.  Cytotec 25 mcg placed at 0857.  Will reassess in 4 hours #Pain: None at this time but planning on epidural #FWB: Category 1, reassuring #ID:  GBS negative  Preeclampsia: Patient's blood pressures have been mildly hypertensive since admission in the 140s-150s/80s-90s.  Patient denies any headaches, changes in vision, increased urination. - Labetalol protocol   Postpartum Planning: - Boy / Bottle /Undecided  - Circumcision: - [  x] TDAP, [Declined] Flu  Gifford Shave 11/10/2018, 9:10 AM

## 2018-11-10 NOTE — Progress Notes (Signed)
Patient Vitals for the past 4 hrs:  BP Temp Temp src Pulse Resp  11/10/18 2202 (!) 151/83 - - 99 18  11/10/18 2146 (!) 157/98 - - 81 18  11/10/18 2132 (!) 162/95 - - 82 18  11/10/18 2117 (!) 156/104 - - 78 18  11/10/18 2102 (!) 160/96 - - 76 18  11/10/18 2016 (!) 154/95 - - 81 18  11/10/18 2001 (!) 151/126 - - (!) 102 18  11/10/18 1923 (!) 154/88 98.6 F (37 C) Oral 80 18   Severe range BPx2.  Will start MgSO4 and oral labetalol.  No change in cx. FHR Cat 1.  Mild and infrequent ctx.  Will continue w/cytotec.

## 2018-11-11 ENCOUNTER — Other Ambulatory Visit: Payer: Self-pay

## 2018-11-11 LAB — CBC
HCT: 33.4 % — ABNORMAL LOW (ref 36.0–46.0)
Hemoglobin: 11.4 g/dL — ABNORMAL LOW (ref 12.0–15.0)
MCH: 30.3 pg (ref 26.0–34.0)
MCHC: 34.1 g/dL (ref 30.0–36.0)
MCV: 88.8 fL (ref 80.0–100.0)
Platelets: 246 10*3/uL (ref 150–400)
RBC: 3.76 MIL/uL — ABNORMAL LOW (ref 3.87–5.11)
RDW: 13.2 % (ref 11.5–15.5)
WBC: 12.1 10*3/uL — ABNORMAL HIGH (ref 4.0–10.5)
nRBC: 0 % (ref 0.0–0.2)

## 2018-11-11 LAB — COMPREHENSIVE METABOLIC PANEL
ALT: 11 U/L (ref 0–44)
AST: 15 U/L (ref 15–41)
Albumin: 2.4 g/dL — ABNORMAL LOW (ref 3.5–5.0)
Alkaline Phosphatase: 149 U/L — ABNORMAL HIGH (ref 38–126)
Anion gap: 12 (ref 5–15)
BUN: 9 mg/dL (ref 6–20)
CO2: 19 mmol/L — ABNORMAL LOW (ref 22–32)
Calcium: 8.2 mg/dL — ABNORMAL LOW (ref 8.9–10.3)
Chloride: 104 mmol/L (ref 98–111)
Creatinine, Ser: 0.75 mg/dL (ref 0.44–1.00)
GFR calc Af Amer: >60 mL/min (ref >60)
GFR calc non Af Amer: >60 mL/min (ref >60)
Glucose, Bld: 94 mg/dL (ref 70–99)
Potassium: 3.8 mmol/L (ref 3.5–5.1)
Sodium: 135 mmol/L (ref 135–145)
Total Bilirubin: 0.6 mg/dL (ref 0.3–1.2)
Total Protein: 5.8 g/dL — ABNORMAL LOW (ref 6.5–8.1)

## 2018-11-11 LAB — GLUCOSE, CAPILLARY
Glucose-Capillary: 108 mg/dL — ABNORMAL HIGH (ref 70–99)
Glucose-Capillary: 91 mg/dL (ref 70–99)
Glucose-Capillary: 106 mg/dL — ABNORMAL HIGH (ref 70–99)
Glucose-Capillary: 109 mg/dL — ABNORMAL HIGH (ref 70–99)
Glucose-Capillary: 90 mg/dL (ref 70–99)

## 2018-11-11 LAB — RPR: RPR Ser Ql: NONREACTIVE

## 2018-11-11 MED ORDER — MAGNESIUM SULFATE 40 GM/1000ML IV SOLN
2.0000 g/h | INTRAVENOUS | Status: DC
  Administered 2018-11-11 – 2018-11-13 (×3): 2 g/h via INTRAVENOUS
  Filled 2018-11-11 (×4): qty 1000

## 2018-11-11 MED ORDER — OXYTOCIN 40 UNITS IN NORMAL SALINE INFUSION - SIMPLE MED
1.0000 m[IU]/min | INTRAVENOUS | Status: DC
  Administered 2018-11-11 – 2018-11-12 (×2): 2 m[IU]/min via INTRAVENOUS
  Filled 2018-11-11: qty 1000

## 2018-11-11 MED ORDER — MISOPROSTOL 50MCG HALF TABLET
50.0000 ug | ORAL_TABLET | ORAL | Status: DC | PRN
  Administered 2018-11-11 (×2): 50 ug via BUCCAL
  Filled 2018-11-11: qty 1

## 2018-11-11 MED ORDER — INFLUENZA VAC SPLIT QUAD 0.5 ML IM SUSY
0.5000 mL | PREFILLED_SYRINGE | INTRAMUSCULAR | Status: AC
  Administered 2018-11-14: 11:00:00 0.5 mL via INTRAMUSCULAR
  Filled 2018-11-11: qty 0.5

## 2018-11-11 MED ORDER — MISOPROSTOL 25 MCG QUARTER TABLET: 25.0000 ug | ORAL_TABLET | ORAL | Status: DC

## 2018-11-11 MED ORDER — TERBUTALINE SULFATE 1 MG/ML IJ SOLN: 0.2500 mg | Freq: Once | INTRAMUSCULAR | Status: DC | PRN

## 2018-11-11 MED ORDER — MISOPROSTOL 50MCG HALF TABLET
ORAL_TABLET | ORAL | Status: AC
  Administered 2018-11-11: 50 ug via BUCCAL
  Filled 2018-11-11: qty 1

## 2018-11-11 NOTE — Progress Notes (Signed)
Valerie Crane is a 23 y.o. G1P0 at [redacted]w[redacted]d admitted for induction of labor due to Gestational diabetes and Hypertension.  Subjective:  RN called, patient's FB had fallen out about 1 hour after placement. She was unable to perform cervical check. She was asking for me to check and possibly replace FB if it had not been in correctly.  Objective: BP 124/76   Pulse 89   Temp 98.1 F (36.7 C) (Oral)   Resp 18   Ht 5\' 8"  (1.727 m)   Wt 134 kg   LMP 02/24/2018 (Exact Date)   BMI 44.92 kg/m  I/O last 3 completed shifts: In: 2167.7 [P.O.:30; I.V.:2137.7] Out: 650 [Urine:650] Total I/O In: 701.5 [P.O.:180; I.V.:521.5] Out: 300 [Urine:300]  FHT:  FHR: 130 bpm, variability: moderate,  accelerations:  Present,  decelerations:  Absent UC:   irregular, every 2-5 minutes SVE:   Dilation: 1 Effacement (%): Thick Station: Ballotable Exam by:: Nira Conn, CNM    Attempted to place cooke catheter without a speculum. I was able to place. I offered patient another attempt with a speculum, but she declines at this time. She is afraid because the speculum is so uncomfortable.    Labs: Lab Results  Component Value Date   WBC 10.5 11/10/2018   HGB 11.2 (L) 11/10/2018   HCT 34.5 (L) 11/10/2018   MCV 91.5 11/10/2018   PLT 226 11/10/2018    Assessment / Plan: IOL, cervical ripening phase   Labor: not in labor at this time Preeclampsia:  on magnesium sulfate Fetal Wellbeing:  Category I Pain Control:  Labor support without medications I/D:  n/a Anticipated MOD:  NSVD  Patient states that she will be ready to attempt again in about 1-2 hours. Will continue current IOL plan at this time and attempt in again shortly.   Marcille Buffy DNP, CNM  11/11/18  1:46 PM

## 2018-11-11 NOTE — Progress Notes (Signed)
Elvy L Kalama is a 23 y.o. G1P0 at [redacted]w[redacted]d admitted for induction of labor due to Pre-eclamptic toxemia of pregnancy..  Subjective: No complaints at this time. Ready to attempt FB placement again.   Objective: BP (!) 145/86   Pulse 93   Temp 98 F (36.7 C) (Oral)   Resp 18   Ht 5\' 8"  (1.727 m)   Wt 134 kg   LMP 02/24/2018 (Exact Date)   BMI 44.92 kg/m  I/O last 3 completed shifts: In: 2167.7 [P.O.:30; I.V.:2137.7] Out: 650 [Urine:650] Total I/O In: 1081.5 [P.O.:560; I.V.:521.5] Out: 1600 [Urine:1600]  FHT:  FHR: 120 bpm, variability: moderate,  accelerations:  Present,  decelerations:  Absent UC:   none SVE:   Dilation: 1.5 Effacement (%): Thick Station: Ballotable Exam by:: Marcille Buffy, CNM   Labs: Lab Results  Component Value Date   WBC 10.5 11/10/2018   HGB 11.2 (L) 11/10/2018   HCT 34.5 (L) 11/10/2018   MCV 91.5 11/10/2018   PLT 226 11/10/2018   Procedure: Patient informed of R/B/A of procedure. NST was performed and was reactive prior to procedure.  Appropriate time out taken. The patient was placed in the lithotomy position and the cervix brought into view with sterile speculum. A ring forcep was used to guide the cookeF foley balloon through the internal os of the cervix. Foley Balloon filled with 60cc of normal saline. Plug inserted into end of the foley. Foley placed on tension and taped to medial thigh.    Assessment / Plan: IOL, still in cervical ripening phase   Labor: cervical ripening  Preeclampsia:  on magnesium sulfate Fetal Wellbeing:  Category I Pain Control:  Labor support without medications I/D:  n/a Anticipated MOD:  NSVD  Marcille Buffy DNP, CNM  11/11/18  4:16 PM

## 2018-11-11 NOTE — Progress Notes (Signed)
Patient ID: Valerie Crane, female   DOB: 09/27/1995, 23 y.o.   MRN: [id number] Valerie Crane is a 23 y.o. G1P0 at [redacted]w[redacted]d admitted for induction of labor due to severe pre-e, also has A1DM.  Subjective: Feeling uc's, trying to hold out on more pain meds. Has gotten 2 doses so far. Denies ha, visual changes, ruq/epigastric pain, n/v.    Objective: BP 134/79   Pulse 89   Temp 98.5 F (36.9 C) (Oral)   Resp 18   Ht 5\' 8"  (1.727 m)   Wt 134 kg   LMP 02/24/2018 (Exact Date)   BMI 44.92 kg/m  Total I/O In: 435.7 [P.O.:200; I.V.:235.7] Out: 200 [Urine:200]  FHT:  FHR: 120 bpm, variability: moderate,  accelerations:  Present,  decelerations:  Absent UC:   q 2-74mins  SVE:   Dilation: 1.5 Effacement (%): Thick Station: Ballotable Exam by:: Marcille Buffy, CNM  Foley bulb in place Pitocin @ 8 mu/min  Labs: Lab Results  Component Value Date   WBC 12.1 (H) 11/11/2018   HGB 11.4 (L) 11/11/2018   HCT 33.4 (L) 11/11/2018   MCV 88.8 11/11/2018   PLT 246 11/11/2018    Assessment / Plan: IOL d/t severe pre-e, also has A1DM w/ suspected LGA EFW 2713g/95% w/ AC >99% @ 33.3wks. S/P cytotec x 7, now has foley bulb since 1617, pitocin @ 62mu- will hold here until foley bulb out  EFW extrapolates to 3700g @ 37.1w  Labor: cervical ripening phase Fetal Wellbeing:  Category I Pain Control:  IV pain meds Pre-eclampsia: on mag, no severe-range bp's, repeat pre-e labs now I/D:  GBS neg Anticipated MOD:  NSVD  [place], WHNP-BC 11/11/2018, 2110

## 2018-11-11 NOTE — Progress Notes (Signed)
LABOR PROGRESS NOTE  Makeya L Rhem is a 23 y.o. G1P0 at [redacted]w[redacted]d  admitted for IOL for Pre-Eclampsia  Subjective: Strip note.  Objective: BP (!) 120/59   Pulse 95   Temp 97.8 F (36.6 C) (Oral)   Resp 18   Ht 5\' 8"  (1.727 m)   Wt 134 kg   LMP 02/24/2018 (Exact Date)   BMI 44.92 kg/m  or  Vitals:   11/11/18 0301 11/11/18 0332 11/11/18 0402 11/11/18 0502  BP: 118/74  (!) 139/94 (!) 120/59  Pulse: 90  89 95  Resp: 18  18 18   Temp:  97.8 F (36.6 C)    TempSrc:  Oral    Weight:      Height:       Dilation: Closed Effacement (%): Thick Cervical Position: Posterior Station: -3 Presentation: Vertex Exam by:: amber pope, rn  FHT: baseline rate 115-120, moderate varibility, + acel, no decel Toco: occasional  Labs: Lab Results  Component Value Date   WBC 10.5 11/10/2018   HGB 11.2 (L) 11/10/2018   HCT 34.5 (L) 11/10/2018   MCV 91.5 11/10/2018   PLT 226 11/10/2018    Patient Active Problem List   Diagnosis Date Noted  . Gestational hypertension, third trimester 11/10/2018  . LGA (large for gestational age) fetus affecting management of mother, third trimester 10/15/2018  . Preeclampsia, third trimester 10/14/2018  . Maternal morbid obesity, antepartum ([place]) 10/09/2018  . Diet controlled gestational diabetes mellitus in third trimester 09/24/2018  . Trichomonal vaginitis during pregnancy in first trimester 06/26/2018  . Excessive weight gain during pregnancy in third trimester 05/12/2018  . Supervision of high-risk pregnancy 05/08/2018    Assessment / Plan: 23 y.o. G1P0 at [redacted]w[redacted]d here for IOL for Pre-Eclampsia now with severe features.  Labor: S/p Cytotec #3 at 0330. Minimal change thus far. Plan to check at 4 hour mark. Consider foley bulb if able. Fetal Wellbeing:  Heart rate on lower end, otherwise reassuring. Will continue to monitor. Pain Control:  Epidural upon request. Anticipated MOD:  NSVD  Severe Pre-E: Blood pressures improved. Currently on Mag and  BID PO Labetalol. Continue to monitor.   Mina Marble, D.O. [place], PGY2 11/11/2018, 5:41 AM

## 2018-11-11 NOTE — Progress Notes (Signed)
LABOR PROGRESS NOTE  Valerie Crane is a 24 y.o. G1P0 at [redacted]w[redacted]d  admitted for  IOL for Pre-Eclampsia  Subjective: Patient doing well. Feeling minimal contractions. Denies any symptoms.   Objective: BP 134/83   Pulse 86   Temp 98.1 F (36.7 C) (Oral)   Resp 18   Ht 5\' 8"  (1.727 m)   Wt 134 kg   LMP 02/24/2018 (Exact Date)   BMI 44.92 kg/m  or  Vitals:   11/11/18 0502 11/11/18 0652 11/11/18 0655 11/11/18 0700  BP: (!) 120/59  137/78 134/83  Pulse: 95  87 86  Resp: 18  17 18   Temp:  98.1 F (36.7 C)    TempSrc:  Oral    Weight:      Height:       Dilation: Closed Effacement (%): Thick Cervical Position: Posterior Station: Ballotable Presentation: Vertex Exam by:: Manus Gunning, CNM  FHT: baseline rate 120, moderate varibility, + acel, no decel Toco: 1-5 mins  Labs: Lab Results  Component Value Date   WBC 10.5 11/10/2018   HGB 11.2 (L) 11/10/2018   HCT 34.5 (L) 11/10/2018   MCV 91.5 11/10/2018   PLT 226 11/10/2018    Patient Active Problem List   Diagnosis Date Noted  . Gestational hypertension, third trimester 11/10/2018  . LGA (large for gestational age) fetus affecting management of mother, third trimester 10/15/2018  . Preeclampsia, third trimester 10/14/2018  . Maternal morbid obesity, antepartum ([place]) 10/09/2018  . Diet controlled gestational diabetes mellitus in third trimester 09/24/2018  . Trichomonal vaginitis during pregnancy in first trimester 06/26/2018  . Excessive weight gain during pregnancy in third trimester 05/12/2018  . Supervision of high-risk pregnancy 05/08/2018    Assessment / Plan: 23 y.o. G1P0 at 102w1d here for  IOL for Pre-Eclampsia  Labor: minimal change in cervical exam. Continues to be thick/closed/posterior. Will give another oral Cytotec. Recheck in 4 hours. Attempt foley bulb if able.  Fetal Wellbeing:  Cat I Pain Control:  Epidural upon request Anticipated MOD:  NSVD  Severe Pre-E: BP remain normotensive. Continue Mag and PO  Labetalol. Remains asymptomatic. Continuetot monitor.   Mina Marble, D.O. [place], PGY2 11/11/2018, 7:51 AM

## 2018-11-11 NOTE — Progress Notes (Signed)
LABOR PROGRESS NOTE  Valerie Crane is a 23 y.o. G1P0 at [redacted]w[redacted]d  admitted for IOL for Pre-Eclampsia  Subjective: Feeling some cramping.   Objective: BP 124/76   Pulse 89   Temp 98.1 F (36.7 C) (Oral)   Resp 18   Ht 5\' 8"  (1.727 m)   Wt 134 kg   LMP 02/24/2018 (Exact Date)   BMI 44.92 kg/m  or  Vitals:   11/11/18 0801 11/11/18 0900 11/11/18 1002 11/11/18 1100  BP: 132/74 (!) 145/85 120/70 124/76  Pulse: 90 90 90 89  Resp: 18 18 18 18   Temp:      TempSrc:      Weight:      Height:       Dilation: Closed Effacement (%): Thick Cervical Position: Posterior Station: Ballotable Presentation: Vertex Exam by:: Manus Gunning, CNM  FHT: 120, moderate varibility, + acel, no decel, reactive  Toco: q22m  Labs: Lab Results  Component Value Date   WBC 10.5 11/10/2018   HGB 11.2 (L) 11/10/2018   HCT 34.5 (L) 11/10/2018   MCV 91.5 11/10/2018   PLT 226 11/10/2018    Patient Active Problem List   Diagnosis Date Noted  . Gestational hypertension, third trimester 11/10/2018  . LGA (large for gestational age) fetus affecting management of mother, third trimester 10/15/2018  . Preeclampsia, third trimester 10/14/2018  . Maternal morbid obesity, antepartum ([place]) 10/09/2018  . Diet controlled gestational diabetes mellitus in third trimester 09/24/2018  . Trichomonal vaginitis during pregnancy in first trimester 06/26/2018  . Excessive weight gain during pregnancy in third trimester 05/12/2018  . Supervision of high-risk pregnancy 05/08/2018    Assessment / Plan: 23 y.o. G1P0 at [redacted]w[redacted]d here for  IOL for Pre-Eclampsia.  Labor: minimal change in cervical exam. FB placed with speculum without difficult this exam. Will give another oral Cytotec. Recheck in 4 hours.  Fetal Wellbeing:  Cat I Pain Control:  Epidural upon request Anticipated MOD:  NSVD  Severe Pre-E: BP remain normotensive. Continue Mag and PO Labetalol. Remains asymptomatic. BP Readings from Last 4 Encounters:  11/11/18  124/76  11/07/18 133/78  11/06/18 134/84  11/05/18 137/84   Barrington Ellison, MD OB Family Medicine Fellow, [facility] for [facility], [place] [facility] 11/11/2018, 12:19 PM

## 2018-11-12 ENCOUNTER — Inpatient Hospital Stay (HOSPITAL_COMMUNITY): Payer: BC Managed Care – PPO | Admitting: Anesthesiology

## 2018-11-12 DIAGNOSIS — O1410 Severe pre-eclampsia, unspecified trimester: Secondary | ICD-10-CM | POA: Diagnosis not present

## 2018-11-12 LAB — COMPREHENSIVE METABOLIC PANEL
ALT: 11 U/L (ref 0–44)
AST: 20 U/L (ref 15–41)
Albumin: 2.2 g/dL — ABNORMAL LOW (ref 3.5–5.0)
Alkaline Phosphatase: 138 U/L — ABNORMAL HIGH (ref 38–126)
Anion gap: 14 (ref 5–15)
BUN: 7 mg/dL (ref 6–20)
CO2: 19 mmol/L — ABNORMAL LOW (ref 22–32)
Calcium: 8.4 mg/dL — ABNORMAL LOW (ref 8.9–10.3)
Chloride: 105 mmol/L (ref 98–111)
Creatinine, Ser: 0.96 mg/dL (ref 0.44–1.00)
GFR calc Af Amer: >60 mL/min (ref >60)
GFR calc non Af Amer: >60 mL/min (ref >60)
Glucose, Bld: 153 mg/dL — ABNORMAL HIGH (ref 70–99)
Potassium: 3.6 mmol/L (ref 3.5–5.1)
Sodium: 138 mmol/L (ref 135–145)
Total Bilirubin: 0.2 mg/dL — ABNORMAL LOW (ref 0.3–1.2)
Total Protein: 5.5 g/dL — ABNORMAL LOW (ref 6.5–8.1)

## 2018-11-12 LAB — CBC
HCT: 32.6 % — ABNORMAL LOW (ref 36.0–46.0)
HCT: 32.1 % — ABNORMAL LOW (ref 36.0–46.0)
Hemoglobin: 10.8 g/dL — ABNORMAL LOW (ref 12.0–15.0)
Hemoglobin: 11.1 g/dL — ABNORMAL LOW (ref 12.0–15.0)
MCH: 29.9 pg (ref 26.0–34.0)
MCH: 31.1 pg (ref 26.0–34.0)
MCHC: 33.1 g/dL (ref 30.0–36.0)
MCHC: 34.6 g/dL (ref 30.0–36.0)
MCV: 90.3 fL (ref 80.0–100.0)
MCV: 89.9 fL (ref 80.0–100.0)
Platelets: 211 10*3/uL (ref 150–400)
Platelets: 239 10*3/uL (ref 150–400)
RBC: 3.61 MIL/uL — ABNORMAL LOW (ref 3.87–5.11)
RBC: 3.57 MIL/uL — ABNORMAL LOW (ref 3.87–5.11)
RDW: 13.4 % (ref 11.5–15.5)
RDW: 13.6 % (ref 11.5–15.5)
WBC: 10.2 10*3/uL (ref 4.0–10.5)
WBC: 9.8 10*3/uL (ref 4.0–10.5)
nRBC: 0 % (ref 0.0–0.2)
nRBC: 0 % (ref 0.0–0.2)

## 2018-11-12 LAB — GLUCOSE, CAPILLARY
Glucose-Capillary: 114 mg/dL — ABNORMAL HIGH (ref 70–99)
Glucose-Capillary: 76 mg/dL (ref 70–99)
Glucose-Capillary: 93 mg/dL (ref 70–99)
Glucose-Capillary: 94 mg/dL (ref 70–99)
Glucose-Capillary: 97 mg/dL (ref 70–99)

## 2018-11-12 MED ORDER — MISOPROSTOL 25 MCG QUARTER TABLET
25.0000 ug | ORAL_TABLET | ORAL | Status: DC
  Administered 2018-11-12: 25 ug via VAGINAL
  Filled 2018-11-12 (×5): qty 1

## 2018-11-12 MED ORDER — SODIUM CHLORIDE (PF) 0.9 % IJ SOLN
INTRAMUSCULAR | Status: DC | PRN
  Administered 2018-11-12: 12 mL/h via EPIDURAL

## 2018-11-12 MED ORDER — LIDOCAINE HCL (PF) 1 % IJ SOLN
INTRAMUSCULAR | Status: DC | PRN
  Administered 2018-11-12: 10 mL via EPIDURAL

## 2018-11-12 NOTE — Anesthesia Preprocedure Evaluation (Signed)
Anesthesia Evaluation  Patient identified by MRN, date of birth, ID band Patient awake    Reviewed: Allergy & Precautions, H&P , NPO status , Patient's Chart, lab work & pertinent test results  History of Anesthesia Complications Negative for: history of anesthetic complications  Airway Mallampati: II  TM Distance: >3 FB Neck ROM: full    Dental no notable dental hx.    Pulmonary neg pulmonary ROS, former smoker,    Pulmonary exam normal        Cardiovascular hypertension, Normal cardiovascular exam Rhythm:regular Rate:Normal     Neuro/Psych negative neurological ROS  negative psych ROS   GI/Hepatic negative GI ROS, Neg liver ROS,   Endo/Other  diabetes, GestationalMorbid obesity  Renal/GU negative Renal ROS  negative genitourinary   Musculoskeletal negative musculoskeletal ROS (+)   Abdominal   Peds  Hematology negative hematology ROS (+)   Anesthesia Other Findings preeclampsia  Reproductive/Obstetrics (+) Pregnancy                             Anesthesia Physical Anesthesia Plan  ASA: III  Anesthesia Plan: Epidural   Post-op Pain Management:    Induction:   PONV Risk Score and Plan:   Airway Management Planned:   Additional Equipment:   Intra-op Plan:   Post-operative Plan:   Informed Consent: I have reviewed the patients History and Physical, chart, labs and discussed the procedure including the risks, benefits and alternatives for the proposed anesthesia with the patient or authorized representative who has indicated his/her understanding and acceptance.       Plan Discussed with:   Anesthesia Plan Comments:         Anesthesia Quick Evaluation

## 2018-11-12 NOTE — Progress Notes (Signed)
LABOR PROGRESS NOTE  Valerie Crane is a 23 y.o. G1P0 at [redacted]w[redacted]d  admitted for IOL for Pre-Eclampsia  Subjective: Not feeling much. Ready for c-section.   Objective: BP (!) 142/81   Pulse 88   Temp 98.8 F (37.1 C) (Axillary)   Resp 18   Ht 5\' 8"  (1.727 m)   Wt 134 kg   LMP 02/24/2018 (Exact Date)   BMI 44.92 kg/m  or  Vitals:   11/12/18 1232 11/12/18 1302 11/12/18 1400 11/12/18 1508  BP: (!) 150/89 (!) 142/81    Pulse: 89 88    Resp:  18 16 18   Temp:      TempSrc:      Weight:      Height:       Dilation: 4 Effacement (%): 30 Cervical Position: Posterior Station: -3 Presentation: Vertex Exam by:: Dr. Marice Potter FHT: 120, moderate varibility, + acel, no decel, reactive  Toco: q2-61m  Labs: Lab Results  Component Value Date   WBC 10.2 11/12/2018   HGB 10.8 (L) 11/12/2018   HCT 32.6 (L) 11/12/2018   MCV 90.3 11/12/2018   PLT 211 11/12/2018    Patient Active Problem List   Diagnosis Date Noted  . Gestational hypertension, third trimester 11/10/2018  . LGA (large for gestational age) fetus affecting management of mother, third trimester 10/15/2018  . Preeclampsia, third trimester 10/14/2018  . Maternal morbid obesity, antepartum ([place]) 10/09/2018  . Diet controlled gestational diabetes mellitus in third trimester 09/24/2018  . Trichomonal vaginitis during pregnancy in first trimester 06/26/2018  . Excessive weight gain during pregnancy in third trimester 05/12/2018  . Supervision of high-risk pregnancy 05/08/2018    Assessment / Plan: 23 y.o. G1P0 at [redacted]w[redacted]d here for  IOL for Pre-Eclampsia.  Labor: Minimal change in cervical exam after 8th Cytotec. Attempted AROM but patient unable to tolerate. She would like C-section. Will discuss with Dr. Elonda Husky and further evaluate. Fetal Wellbeing:  Cat I Pain Control:  Epidural upon request Anticipated MOD:  NSVD  Severe Pre-E: BP remain normotensive. Continue Mag and PO Labetalol. Remains asymptomatic. BP Readings from Last  4 Encounters:  11/12/18 (!) 142/81  11/07/18 133/78  11/06/18 134/84  11/05/18 137/84   Barrington Ellison, MD OB Family Medicine Fellow, [facility] for [facility], [place] [facility] 11/12/2018, 4:59 PM

## 2018-11-12 NOTE — Progress Notes (Signed)
LABOR PROGRESS NOTE  Valerie Crane is a 23 y.o. G1P0 at [redacted]w[redacted]d  admitted for IOL for Pre-Eclampsia  Subjective: Feeling some cramping but very minimal.   Objective: BP (!) 158/91   Pulse 84   Temp 98.8 F (37.1 C) (Axillary)   Resp 16   Ht 5\' 8"  (1.727 m)   Wt 134 kg   LMP 02/24/2018 (Exact Date)   BMI 44.92 kg/m  or  Vitals:   11/12/18 1100 11/12/18 1117 11/12/18 1132 11/12/18 1200  BP:  (!) 151/87 (!) 158/91   Pulse:  88 84   Resp: 16   16  Temp:      TempSrc:      Weight:      Height:       Dilation: 4 Effacement (%): 30 Cervical Position: Posterior Station: -3 Presentation: Vertex Exam by:: lee FHT: 120, moderate varibility, + acel, no decel, reactive  Toco: infrequent   Labs: Lab Results  Component Value Date   WBC 10.2 11/12/2018   HGB 10.8 (L) 11/12/2018   HCT 32.6 (L) 11/12/2018   MCV 90.3 11/12/2018   PLT 211 11/12/2018    Patient Active Problem List   Diagnosis Date Noted  . Gestational hypertension, third trimester 11/10/2018  . LGA (large for gestational age) fetus affecting management of mother, third trimester 10/15/2018  . Preeclampsia, third trimester 10/14/2018  . Maternal morbid obesity, antepartum ([place]) 10/09/2018  . Diet controlled gestational diabetes mellitus in third trimester 09/24/2018  . Trichomonal vaginitis during pregnancy in first trimester 06/26/2018  . Excessive weight gain during pregnancy in third trimester 05/12/2018  . Supervision of high-risk pregnancy 05/08/2018    Assessment / Plan: 23 y.o. G1P0 at [redacted]w[redacted]d here for  IOL for Pre-Eclampsia.  Labor: Minimal change in cervical exam; will turn off Pit and give another Cytotec due to thickness. Repeat SVE in 4 hours or as clinically indicated. Discussed induction progress in depth. Patient positive and would like to continue to try for vaginal delivery at this time. Should she not make progress by the end of today, she would maybe like a c-section tomorrow.  Fetal  Wellbeing:  Cat I Pain Control:  Epidural upon request Anticipated MOD:  NSVD  Severe Pre-E: BP remain normotensive. Continue Mag and PO Labetalol. Remains asymptomatic. BP Readings from Last 4 Encounters:  11/12/18 (!) 158/91  11/07/18 133/78  11/06/18 134/84  11/05/18 137/84   Barrington Ellison, MD OB Family Medicine Fellow, [facility] for [facility], [place] [facility] 11/12/2018, 12:16 PM

## 2018-11-12 NOTE — Progress Notes (Signed)
Patient ID: Valerie Crane, female   DOB: April 05, 1995, 23 y.o.   MRN: [id number] Valerie KELEMEN is a 23 y.o. G1P0 at [redacted]w[redacted]d admitted for induction of labor due to severe pre-e, also has A1DM.  Subjective: Doing well, foley bulb out  Objective: BP (!) 153/88   Pulse 89   Temp 98.5 F (36.9 C) (Oral)   Resp 18   Ht 5\' 8"  (1.727 m)   Wt 134 kg   LMP 02/24/2018 (Exact Date)   BMI 44.92 kg/m  Total I/O In: 987.3 [P.O.:400; I.V.:587.3] Out: 900 [Urine:900]  FHT:  FHR: 120 bpm, variability: moderate,  accelerations:  Present,  decelerations:  Absent UC:   q 2-60mins  SVE:   Dilation: 3.5 Effacement (%): 50 Station: -3, Ballotable Exam by:: Valerie McHenry RN  Pitocin @ 8 mu/min  Labs: Lab Results  Component Value Date   WBC 12.1 (H) 11/11/2018   HGB 11.4 (L) 11/11/2018   HCT 33.4 (L) 11/11/2018   MCV 88.8 11/11/2018   PLT 246 11/11/2018   CBG (last 3)  Recent Labs    11/11/18 1127 11/11/18 1636 11/11/18 2056  GLUCAP 106* 109* 90     Assessment / Plan: IOL d/t severe pre-e, foley bulb now out, will continue increasing pitocin per protocol to acheive adequate labor  Labor: s/p cervical ripening Fetal Wellbeing:  Category I Pain Control:  IV pain meds Pre-eclampsia: bp's and labs stable, asymptomatic, on mag I/D:  GBS neg Anticipated MOD:  NSVD  Valerie Crane CNM, WHNP-BC 11/12/2018, 12:17 AM

## 2018-11-12 NOTE — Progress Notes (Signed)
Patient ID: Valerie Crane, female   DOB: 1996-01-25, 23 y.o.   MRN: [id number] SVE to determine if more cytotec would be beneficial Cx: 4/50/-3, soft Cat 1FHR Will continue w/ plan for pit break, restart @ [address], CNM, [facility] 11/12/2018 7:20 AM

## 2018-11-12 NOTE — Anesthesia Procedure Notes (Signed)
Epidural Patient location during procedure: OB Start time: 11/12/2018 7:49 PM End time: 11/12/2018 7:59 PM  Staffing Anesthesiologist: Lidia Collum, MD Performed: anesthesiologist   Preanesthetic Checklist Completed: patient identified, pre-op evaluation, timeout performed, IV checked, risks and benefits discussed and monitors and equipment checked  Epidural Patient position: sitting Prep: DuraPrep Patient monitoring: heart rate, continuous pulse ox and blood pressure Approach: midline Location: L3-L4 Injection technique: LOR air  Needle:  Needle type: Tuohy  Needle gauge: 17 G Needle length: 9 cm Needle insertion depth: 7 cm Catheter type: closed end flexible Catheter size: 19 Gauge Catheter at skin depth: 12 cm Test dose: negative  Assessment Events: blood not aspirated, injection not painful, no injection resistance, negative IV test and no paresthesia  Additional Notes Reason for block:procedure for pain

## 2018-11-12 NOTE — Progress Notes (Signed)
Patient ID: Valerie Crane, female   DOB: Jun 28, 1995, 23 y.o.   MRN: [id number]  Began IOL for initially pre-e without SF on 10/5 in the late morning with multiple doses of cytotec until foley was able to be placed at 1600 on 10/6. In the meantime pt began having severe range BPs requiring mag sulfate and a new dx of pre-e with SF. She has had Pitocin started and stopped a couple of times, and most recently it was stopped at noon today for an additional dose of cytotec to achieve some cx softening. Now pt has an epidural and is ready for another cx exam/AROM since she is comfortable. Denies s/s pre-e.  BP 141/93, P 84 FHR 115-120, +accels, decreased LTV at times, no decels Ctx irreg Cx post 4+/70/vtx -3; AROM for clear fluid & IUPC placed  CBGs mostly 90s, one 114  IUP@37 .2wks Pre-e w/ severe features GDMA1 Cx favorable  -Will restart Pit and titrate up to adequate MVUs as baby tolerates -Plan for next cx exam once MVUs are adequate  Myrtis Ser CNM 11/12/2018 10:58 PM

## 2018-11-12 NOTE — Progress Notes (Signed)
Patient ID: Valerie Crane, female   DOB: 06-13-1995, 23 y.o.   MRN: [id number] Valerie Crane is a 23 y.o. G1P0 at [redacted]w[redacted]d admitted for induction of labor due to severe pre-e, also has A1DM w/ LGA.  Subjective: Doing well, no complaints. Denies ha, visual changes, ruq/epigastric pain, n/v.    Objective: BP (!) 152/86   Pulse 79   Temp 98.8 F (37.1 C) (Axillary)   Resp 18   Ht 5\' 8"  (1.727 m)   Wt 134 kg   LMP 02/24/2018 (Exact Date)   BMI 44.92 kg/m  Total I/O In: 1736.4 [P.O.:650; I.V.:1086.4] Out: 2400 [Urine:2400]  FHT:  FHR: 115 bpm, variability: moderate,  accelerations:  Present,  decelerations:  Absent UC:   irregular, every 1-5 minutes  SVE:   Dilation: 3.5 Effacement (%): 50 Station: -3, Ballotable Exam by:: [place] McHenry RN  Pitocin off @ [phone]  Labs: Lab Results  Component Value Date   WBC 12.1 (H) 11/11/2018   HGB 11.4 (L) 11/11/2018   HCT 33.4 (L) 11/11/2018   MCV 88.8 11/11/2018   PLT 246 11/11/2018   CBG (last 3)  Recent Labs    11/11/18 1636 11/11/18 2056 11/12/18 0107  GLUCAP 109* 90 55     Assessment / Plan:   IOL d/t severe pre-e, also has A1DM w/ suspected LGA. Foley bulb out at 2345. On pitocin since 1615 yesterday, was up to 54mu and uc's very mild/irregular. Pit break. Plan to restart @ 0800.   Labor: s/p cervical ripening Fetal Wellbeing:  Category I Pain Control:  no IV pain meds since 0115 Pre-eclampsia: stable, on mag I/D:  GBS neg Anticipated MOD:  NSVD  Roma Schanz CNM, WHNP-BC 11/12/2018, 5:53 AM

## 2018-11-13 ENCOUNTER — Encounter (HOSPITAL_COMMUNITY): Payer: Self-pay | Admitting: *Deleted

## 2018-11-13 ENCOUNTER — Encounter (HOSPITAL_COMMUNITY)
Admission: AD | Disposition: A | Discharge status: Home / Self Care | Payer: Self-pay | Source: Home / Self Care | Attending: Family Medicine

## 2018-11-13 DIAGNOSIS — Z3A37 37 weeks gestation of pregnancy: Secondary | ICD-10-CM

## 2018-11-13 DIAGNOSIS — O1414 Severe pre-eclampsia complicating childbirth: Secondary | ICD-10-CM

## 2018-11-13 LAB — CREATININE, SERUM
Creatinine, Ser: 0.85 mg/dL (ref 0.44–1.00)
GFR calc Af Amer: >60 mL/min (ref >60)
GFR calc non Af Amer: >60 mL/min (ref >60)

## 2018-11-13 LAB — CBC
HCT: 33.8 % — ABNORMAL LOW (ref 36.0–46.0)
HCT: 34.2 % — ABNORMAL LOW (ref 36.0–46.0)
Hemoglobin: 11.6 g/dL — ABNORMAL LOW (ref 12.0–15.0)
Hemoglobin: 10.9 g/dL — ABNORMAL LOW (ref 12.0–15.0)
MCH: 30.7 pg (ref 26.0–34.0)
MCH: 30.5 pg (ref 26.0–34.0)
MCHC: 34.3 g/dL (ref 30.0–36.0)
MCHC: 31.9 g/dL (ref 30.0–36.0)
MCV: 89.4 fL (ref 80.0–100.0)
MCV: 95.8 fL (ref 80.0–100.0)
Platelets: 227 10*3/uL (ref 150–400)
Platelets: UNDETERMINED 10*3/uL (ref 150–400)
RBC: 3.78 MIL/uL — ABNORMAL LOW (ref 3.87–5.11)
RBC: 3.57 MIL/uL — ABNORMAL LOW (ref 3.87–5.11)
RDW: 13.6 % (ref 11.5–15.5)
RDW: 13.5 % (ref 11.5–15.5)
WBC: 13.6 10*3/uL — ABNORMAL HIGH (ref 4.0–10.5)
WBC: 15.4 10*3/uL — ABNORMAL HIGH (ref 4.0–10.5)
nRBC: 0 % (ref 0.0–0.2)
nRBC: 0 % (ref 0.0–0.2)

## 2018-11-13 LAB — COMPREHENSIVE METABOLIC PANEL
ALT: 10 U/L (ref 0–44)
AST: 14 U/L — ABNORMAL LOW (ref 15–41)
Albumin: 2.3 g/dL — ABNORMAL LOW (ref 3.5–5.0)
Alkaline Phosphatase: 144 U/L — ABNORMAL HIGH (ref 38–126)
Anion gap: 11 (ref 5–15)
BUN: 9 mg/dL (ref 6–20)
CO2: 22 mmol/L (ref 22–32)
Calcium: 8.3 mg/dL — ABNORMAL LOW (ref 8.9–10.3)
Chloride: 105 mmol/L (ref 98–111)
Creatinine, Ser: 0.81 mg/dL (ref 0.44–1.00)
GFR calc Af Amer: >60 mL/min (ref >60)
GFR calc non Af Amer: >60 mL/min (ref >60)
Glucose, Bld: 85 mg/dL (ref 70–99)
Potassium: 3.9 mmol/L (ref 3.5–5.1)
Sodium: 138 mmol/L (ref 135–145)
Total Bilirubin: 0.3 mg/dL (ref 0.3–1.2)
Total Protein: 5.7 g/dL — ABNORMAL LOW (ref 6.5–8.1)

## 2018-11-13 LAB — GLUCOSE, CAPILLARY
Glucose-Capillary: 81 mg/dL (ref 70–99)
Glucose-Capillary: 86 mg/dL (ref 70–99)

## 2018-11-13 SURGERY — Surgical Case
Anesthesia: Epidural

## 2018-11-13 MED ORDER — FENTANYL CITRATE (PF) 100 MCG/2ML IJ SOLN: 100.0000 ug | Freq: Once | INTRAMUSCULAR | Status: DC

## 2018-11-13 MED ORDER — SENNOSIDES-DOCUSATE SODIUM 8.6-50 MG PO TABS
2.0000 | ORAL_TABLET | ORAL | Status: DC
  Administered 2018-11-13 – 2018-11-15 (×3): 2 via ORAL
  Filled 2018-11-13 (×3): qty 2

## 2018-11-13 MED ORDER — OXYTOCIN 40 UNITS IN NORMAL SALINE INFUSION - SIMPLE MED
INTRAVENOUS | Status: AC
  Filled 2018-11-13: qty 1000

## 2018-11-13 MED ORDER — BUPIVACAINE HCL (PF) 0.25 % IJ SOLN
INTRAMUSCULAR | Status: DC | PRN
  Administered 2018-11-13: 8 mL via EPIDURAL

## 2018-11-13 MED ORDER — TETANUS-DIPHTH-ACELL PERTUSSIS 5-2.5-18.5 LF-MCG/0.5 IM SUSP: 0.5000 mL | Freq: Once | INTRAMUSCULAR | Status: DC

## 2018-11-13 MED ORDER — DIPHENHYDRAMINE HCL 25 MG PO CAPS: 25.0000 mg | ORAL_CAPSULE | Freq: Four times a day (QID) | ORAL | Status: DC | PRN

## 2018-11-13 MED ORDER — BUPIVACAINE HCL (PF) 0.25 % IJ SOLN
INTRAMUSCULAR | Status: AC
  Filled 2018-11-13: qty 30

## 2018-11-13 MED ORDER — OXYCODONE HCL 5 MG/5ML PO SOLN: 5.0000 mg | Freq: Once | ORAL | Status: DC | PRN

## 2018-11-13 MED ORDER — DEXTROSE 5 % IV SOLN
INTRAVENOUS | Status: AC
  Filled 2018-11-13: qty 3000

## 2018-11-13 MED ORDER — DEXTROSE 5 % IV SOLN
3.0000 g | Freq: Once | INTRAVENOUS | Status: AC
  Administered 2018-11-13: 3 g via INTRAVENOUS
  Filled 2018-11-13: qty 3000

## 2018-11-13 MED ORDER — SIMETHICONE 80 MG PO CHEW
80.0000 mg | CHEWABLE_TABLET | Freq: Three times a day (TID) | ORAL | Status: DC
  Administered 2018-11-13 – 2018-11-15 (×7): 80 mg via ORAL
  Filled 2018-11-13 (×7): qty 1

## 2018-11-13 MED ORDER — OXYCODONE HCL 5 MG PO TABS
5.0000 mg | ORAL_TABLET | ORAL | Status: DC | PRN
  Administered 2018-11-14 – 2018-11-15 (×4): 10 mg via ORAL
  Administered 2018-11-15: 5 mg via ORAL
  Administered 2018-11-15: 10 mg via ORAL
  Administered 2018-11-15: 5 mg via ORAL
  Administered 2018-11-15 – 2018-11-16 (×4): 10 mg via ORAL
  Filled 2018-11-13 (×2): qty 1
  Filled 2018-11-13 (×3): qty 2
  Filled 2018-11-13: qty 1
  Filled 2018-11-13 (×4): qty 2
  Filled 2018-11-13: qty 1
  Filled 2018-11-13: qty 2

## 2018-11-13 MED ORDER — SIMETHICONE 80 MG PO CHEW
80.0000 mg | CHEWABLE_TABLET | ORAL | Status: DC
  Administered 2018-11-13 – 2018-11-15 (×3): 80 mg via ORAL
  Filled 2018-11-13 (×3): qty 1

## 2018-11-13 MED ORDER — ONDANSETRON HCL 4 MG/2ML IJ SOLN
INTRAMUSCULAR | Status: AC
  Filled 2018-11-13: qty 2

## 2018-11-13 MED ORDER — OXYTOCIN 40 UNITS IN NORMAL SALINE INFUSION - SIMPLE MED
INTRAVENOUS | Status: DC | PRN
  Administered 2018-11-13: 200 mL via INTRAVENOUS

## 2018-11-13 MED ORDER — IBUPROFEN 600 MG PO TABS
600.0000 mg | ORAL_TABLET | Freq: Four times a day (QID) | ORAL | Status: DC | PRN
  Administered 2018-11-13 – 2018-11-16 (×9): 600 mg via ORAL
  Filled 2018-11-13 (×9): qty 1

## 2018-11-13 MED ORDER — MORPHINE SULFATE (PF) 0.5 MG/ML IJ SOLN
INTRAMUSCULAR | Status: AC
  Filled 2018-11-13: qty 10

## 2018-11-13 MED ORDER — MORPHINE SULFATE (PF) 0.5 MG/ML IJ SOLN
INTRAMUSCULAR | Status: DC | PRN
  Administered 2018-11-13: 3 mg via EPIDURAL

## 2018-11-13 MED ORDER — WITCH HAZEL-GLYCERIN EX PADS: 1.0000 "application " | MEDICATED_PAD | CUTANEOUS | Status: DC | PRN

## 2018-11-13 MED ORDER — ACETAMINOPHEN 325 MG PO TABS
650.0000 mg | ORAL_TABLET | Freq: Four times a day (QID) | ORAL | Status: DC | PRN
  Administered 2018-11-14 – 2018-11-16 (×3): 650 mg via ORAL
  Filled 2018-11-13 (×4): qty 2

## 2018-11-13 MED ORDER — ENOXAPARIN SODIUM 80 MG/0.8ML ~~LOC~~ SOLN
0.5000 mg/kg | SUBCUTANEOUS | Status: DC
  Administered 2018-11-14 – 2018-11-15 (×2): 65 mg via SUBCUTANEOUS
  Filled 2018-11-13 (×2): qty 0.8

## 2018-11-13 MED ORDER — MENTHOL 3 MG MT LOZG: 1.0000 | LOZENGE | OROMUCOSAL | Status: DC | PRN

## 2018-11-13 MED ORDER — BUPIVACAINE HCL (PF) 0.25 % IJ SOLN
INTRAMUSCULAR | Status: DC | PRN
  Administered 2018-11-13: 10 mL

## 2018-11-13 MED ORDER — MAGNESIUM SULFATE 40 GM/1000ML IV SOLN
2.0000 g/h | INTRAVENOUS | Status: AC
  Administered 2018-11-14: 2 g/h via INTRAVENOUS
  Filled 2018-11-13: qty 1000

## 2018-11-13 MED ORDER — OXYTOCIN 40 UNITS IN NORMAL SALINE INFUSION - SIMPLE MED: 2.5000 [IU]/h | INTRAVENOUS | Status: AC

## 2018-11-13 MED ORDER — CEFAZOLIN (ANCEF) 1 G IV SOLR: 2.0000 g | INTRAVENOUS | Status: DC

## 2018-11-13 MED ORDER — SODIUM CHLORIDE 0.9 % IV SOLN
INTRAVENOUS | Status: AC
  Filled 2018-11-13: qty 500

## 2018-11-13 MED ORDER — DEXAMETHASONE SODIUM PHOSPHATE 4 MG/ML IJ SOLN
INTRAMUSCULAR | Status: DC | PRN
  Administered 2018-11-13: 4 mg via INTRAVENOUS

## 2018-11-13 MED ORDER — LACTATED RINGERS IV SOLN
INTRAVENOUS | Status: DC
  Administered 2018-11-14: 01:00:00 via INTRAVENOUS

## 2018-11-13 MED ORDER — OXYCODONE HCL 5 MG PO TABS: 5.0000 mg | ORAL_TABLET | Freq: Once | ORAL | Status: DC | PRN

## 2018-11-13 MED ORDER — FENTANYL CITRATE (PF) 100 MCG/2ML IJ SOLN
INTRAMUSCULAR | Status: AC
  Filled 2018-11-13: qty 2

## 2018-11-13 MED ORDER — ZOLPIDEM TARTRATE 5 MG PO TABS: 5.0000 mg | ORAL_TABLET | Freq: Every evening | ORAL | Status: DC | PRN

## 2018-11-13 MED ORDER — SODIUM CHLORIDE 0.9 % IV SOLN
500.0000 mg | Freq: Once | INTRAVENOUS | Status: AC
  Administered 2018-11-13: 14:00:00 500 mg via INTRAVENOUS

## 2018-11-13 MED ORDER — KETOROLAC TROMETHAMINE 30 MG/ML IJ SOLN
30.0000 mg | Freq: Once | INTRAMUSCULAR | Status: AC | PRN
  Administered 2018-11-13: 30 mg via INTRAVENOUS

## 2018-11-13 MED ORDER — SODIUM CHLORIDE 0.9 % IR SOLN
Status: DC | PRN
  Administered 2018-11-13: 1

## 2018-11-13 MED ORDER — HYDROMORPHONE HCL 1 MG/ML IJ SOLN
INTRAMUSCULAR | Status: AC
  Filled 2018-11-13: qty 0.5

## 2018-11-13 MED ORDER — COCONUT OIL OIL: 1.0000 "application " | TOPICAL_OIL | Status: DC | PRN

## 2018-11-13 MED ORDER — DIBUCAINE (PERIANAL) 1 % EX OINT: 1.0000 "application " | TOPICAL_OINTMENT | CUTANEOUS | Status: DC | PRN

## 2018-11-13 MED ORDER — FENTANYL CITRATE (PF) 100 MCG/2ML IJ SOLN
INTRAMUSCULAR | Status: DC | PRN
  Administered 2018-11-13 (×2): 100 ug via EPIDURAL

## 2018-11-13 MED ORDER — PRENATAL MULTIVITAMIN CH
1.0000 | ORAL_TABLET | Freq: Every day | ORAL | Status: DC
  Administered 2018-11-14 – 2018-11-16 (×4): 1 via ORAL
  Filled 2018-11-13 (×4): qty 1

## 2018-11-13 MED ORDER — HYDROMORPHONE HCL 1 MG/ML IJ SOLN
0.2500 mg | INTRAMUSCULAR | Status: DC | PRN
  Administered 2018-11-13: 0.5 mg via INTRAVENOUS

## 2018-11-13 MED ORDER — SIMETHICONE 80 MG PO CHEW: 80.0000 mg | CHEWABLE_TABLET | ORAL | Status: DC | PRN

## 2018-11-13 MED ORDER — KETOROLAC TROMETHAMINE 30 MG/ML IJ SOLN
INTRAMUSCULAR | Status: AC
  Filled 2018-11-13: qty 1

## 2018-11-13 MED ORDER — STERILE WATER FOR IRRIGATION IR SOLN
Status: DC | PRN
  Administered 2018-11-13: 1

## 2018-11-13 MED ORDER — DEXAMETHASONE SODIUM PHOSPHATE 4 MG/ML IJ SOLN
INTRAMUSCULAR | Status: AC
  Filled 2018-11-13: qty 1

## 2018-11-13 MED ORDER — ONDANSETRON HCL 4 MG/2ML IJ SOLN: 4.0000 mg | Freq: Once | INTRAMUSCULAR | Status: DC | PRN

## 2018-11-13 MED ORDER — LIDOCAINE-EPINEPHRINE (PF) 2 %-1:200000 IJ SOLN
INTRAMUSCULAR | Status: DC | PRN
  Administered 2018-11-13 (×2): 5 mL via INTRADERMAL
  Administered 2018-11-13: 5 mL via EPIDURAL

## 2018-11-13 MED ORDER — ONDANSETRON HCL 4 MG/2ML IJ SOLN
INTRAMUSCULAR | Status: DC | PRN
  Administered 2018-11-13: 4 mg via INTRAVENOUS

## 2018-11-13 SURGICAL SUPPLY — 38 items
APL SKNCLS STERI-STRIP NONHPOA (GAUZE/BANDAGES/DRESSINGS) ×1
BENZOIN TINCTURE PRP APPL 2/3 (GAUZE/BANDAGES/DRESSINGS) ×3 IMPLANT
CHLORAPREP W/TINT 26ML (MISCELLANEOUS) ×3 IMPLANT
CLAMP CORD UMBIL (MISCELLANEOUS) IMPLANT
CLOSURE STERI STRIP 1/2 X4 (GAUZE/BANDAGES/DRESSINGS) ×2 IMPLANT
CLOTH BEACON ORANGE TIMEOUT ST (SAFETY) ×3 IMPLANT
DRSG OPSITE POSTOP 4X10 (GAUZE/BANDAGES/DRESSINGS) ×3 IMPLANT
ELECT REM PT RETURN 9FT ADLT (ELECTROSURGICAL) ×3
ELECTRODE REM PT RTRN 9FT ADLT (ELECTROSURGICAL) ×1 IMPLANT
EXTRACTOR VACUUM M CUP 4 TUBE (SUCTIONS) IMPLANT
EXTRACTOR VACUUM M CUP 4' TUBE (SUCTIONS)
GAUZE SPONGE 4X4 12PLY STRL LF (GAUZE/BANDAGES/DRESSINGS) ×4 IMPLANT
GLOVE BIOGEL PI IND STRL 7.0 (GLOVE) ×2 IMPLANT
GLOVE BIOGEL PI INDICATOR 7.0 (GLOVE) ×4
GLOVE ECLIPSE 7.0 STRL STRAW (GLOVE) ×6 IMPLANT
GOWN STRL REUS W/TWL LRG LVL3 (GOWN DISPOSABLE) ×6 IMPLANT
HEMOSTAT ARISTA ABSORB 3G PWDR (HEMOSTASIS) ×2 IMPLANT
HOVERMATT SINGLE USE (MISCELLANEOUS) ×2 IMPLANT
KIT ABG SYR 3ML LUER SLIP (SYRINGE) IMPLANT
NDL HYPO 25X5/8 SAFETYGLIDE (NEEDLE) IMPLANT
NEEDLE HYPO 22GX1.5 SAFETY (NEEDLE) ×3 IMPLANT
NEEDLE HYPO 25X5/8 SAFETYGLIDE (NEEDLE) IMPLANT
NS IRRIG 1000ML POUR BTL (IV SOLUTION) ×3 IMPLANT
PACK C SECTION WH (CUSTOM PROCEDURE TRAY) ×3 IMPLANT
PAD ABD 7.5X8 STRL (GAUZE/BANDAGES/DRESSINGS) ×3 IMPLANT
PAD OB MATERNITY 4.3X12.25 (PERSONAL CARE ITEMS) ×3 IMPLANT
PENCIL SMOKE EVAC W/HOLSTER (ELECTROSURGICAL) ×3 IMPLANT
RTRCTR C-SECT PINK 25CM LRG (MISCELLANEOUS) ×3 IMPLANT
SPONGE GAUZE 4X4 12PLY STER LF (GAUZE/BANDAGES/DRESSINGS) ×3 IMPLANT
STRIP CLOSURE SKIN 1/2X4 (GAUZE/BANDAGES/DRESSINGS) ×2 IMPLANT
SUT PLAIN 2 0 XLH (SUTURE) ×2 IMPLANT
SUT VIC AB 0 CTX 36 (SUTURE) ×12
SUT VIC AB 0 CTX36XBRD ANBCTRL (SUTURE) ×3 IMPLANT
SUT VIC AB 4-0 KS 27 (SUTURE) ×3 IMPLANT
SYR 30ML LL (SYRINGE) ×3 IMPLANT
TOWEL OR 17X24 6PK STRL BLUE (TOWEL DISPOSABLE) ×3 IMPLANT
TRAY FOLEY W/BAG SLVR 14FR LF (SET/KITS/TRAYS/PACK) ×3 IMPLANT
WATER STERILE IRR 1000ML POUR (IV SOLUTION) ×3 IMPLANT

## 2018-11-13 NOTE — Progress Notes (Signed)
Went bedside to discuss plan of care. Patient was induced on 10/5 for severe Pre-E secondary to blood pressures. She has had 8 Cytotec, Foley bulb and Pitocin with two Pit breaks. AROM with IUPC placement last night at 2230. MVU's adequate overnight. She has been 4cm since 0600 on 10/7 and despite made minimal cervical change overnight despite AROM and adequate MVU's. Patient would like to proceed with cesarean section.  The risks of cesarean section were discussed with the patient including but were not limited to: bleeding which may require transfusion or reoperation; infection which may require antibiotics; injury to bowel, bladder, ureters or other surrounding organs; injury to the fetus; need for additional procedures including hysterectomy in the event of a life-threatening hemorrhage; placental abnormalities wth subsequent pregnancies, incisional problems, thromboembolic phenomenon and other postoperative/anesthesia complications. The patient concurred with the proposed plan, giving informed written consent for the procedures.  Patient has been NPO since prior to midnight she will remain NPO for procedure. Anesthesia and OR aware.  Preoperative prophylactic antibiotics and SCDs ordered on call to the OR. To OR when ready.  D/w Dr. Kennon Rounds.  Barrington Ellison, MD [facility] Family Medicine Fellow, [place] for [employer], [place]

## 2018-11-13 NOTE — Op Note (Signed)
Mykia Karrie Doffing PROCEDURE DATE: 11/13/2018  PREOPERATIVE DIAGNOSES: Intrauterine pregnancy at [redacted]w[redacted]d weeks gestation; failure to progress: arrest of dilation  POSTOPERATIVE DIAGNOSES: The same  PROCEDURE: Primary Low Transverse Cesarean Section  SURGEON:  Dr. Tinnie Gens  ASSISTANT:  Jerilynn Birkenhead, MD  ANESTHESIOLOGY TEAM: Anesthesiologist: Trevor Iha, MD; Lucretia Kern, MD  INDICATIONS: Valerie Crane is a 23 y.o. G1P1001 at [redacted]w[redacted]d here for cesarean section secondary to the indications listed under preoperative diagnoses; please see preoperative note for further details.  The risks of cesarean section were discussed with the patient including but were not limited to: bleeding which may require transfusion or reoperation; infection which may require antibiotics; injury to bowel, bladder, ureters or other surrounding organs; injury to the fetus; need for additional procedures including hysterectomy in the event of a life-threatening hemorrhage; placental abnormalities wth subsequent pregnancies, incisional problems, thromboembolic phenomenon and other postoperative/anesthesia complications.   The patient concurred with the proposed plan, giving informed written consent for the procedure.    FINDINGS:  Viable female infant in cephalic presentation. Clear amniotic fluid with forebag rupture.  Intact placenta, three vessel cord.  Normal uterus, fallopian tubes and ovaries bilaterally. APGAR (1 MIN): 9   APGAR (5 MINS): 9   APGAR (10 MINS):    ANESTHESIA: Epidural  INTRAVENOUS FLUIDS: 1794 ml   ESTIMATED BLOOD LOSS: 1032 ml URINE OUTPUT:  1500 ml SPECIMENS: Placenta sent to L&D COMPLICATIONS: None immediate  PROCEDURE IN DETAIL:  The patient preoperatively received intravenous antibiotics and had sequential compression devices applied to her lower extremities.  She was then taken to the operating room where the epidural anesthesia was dosed up to surgical level and was found to be  adequate. She was then placed in a dorsal supine position with a leftward tilt, and prepped and draped in a sterile manner.  A foley catheter had been previously placed into her bladder and attached to constant gravity.  After an adequate timeout was performed, a Pfannenstiel skin incision was made with scalpel and carried through to the underlying layer of fascia. The fascia was incised in the midline, and this incision was extended bilaterally with blunt dissection.  Kocher clamps were applied to the superior aspect of the fascial incision and the underlying rectus muscles were dissected off bluntly. The rectus muscles were separated in the midline and the peritoneum was entered bluntly. The Alexis self-retaining retractor was introduced into the abdominal cavity.  Attention was turned to the lower uterine segment where a low transverse hysterotomy was made with a scalpel and extended bilaterally bluntly.  The infant was successfully delivered, the cord was clamped and cut after one minute, and the infant was handed over to the awaiting neonatology team. Uterine massage was then administered, and the placenta delivered intact with a three-vessel cord. The uterus was then cleared of clots and debris.  The hysterotomy was closed with 0 Vicryl in a running locked fashion, and an imbricating layer was also placed with 0 Vicryl. The pelvis was cleared of all clot and debris. Hemostasis was confirmed on all surfaces.  The retractor was removed.  The peritoneum was closed with a 0 Vicryl running stitch. The fascia was then closed using 0 Vicryl in a running fashion.  The subcutaneous layer was irrigated, reapproximated with 2-0 plain gut interrupted stitches, and the skin was closed with a 4-0 Vicryl subcuticular stitch. 10 mL 0.5% Marcaine injected around incision site. The patient tolerated the procedure well. Sponge, instrument and needle counts were  correct x 3.  She was taken to the recovery room in stable  condition.   Barrington Ellison, MD [facility] Fellow, [facility] for [employer], [place]

## 2018-11-13 NOTE — Progress Notes (Addendum)
Patient ID: Valerie Crane, female   DOB: 07/07/1995, 23 y.o.   MRN: [id number]  Comfortable with epidural  BP 141/84, P 94, T 99 FHR 130s, +accels, no decels, occ early variables Ctx with adequate MVUs on 55mu Pit Cx 5/70/vtx -2  IUP@37 .3 Severe pre-e GDMA1 IOL process  Cx with minimal change since AROM/Pit. Will pass on plan to recheck cx at approx 1030 Hopeful for vag del CMET pending  Myrtis Ser [facility] 11/13/2018 6:44 AM

## 2018-11-13 NOTE — Discharge Summary (Signed)
Postpartum Discharge Summary  Date of Service updated 11/16/18     Patient Name: Valerie Crane DOB: [birth year] MRN: [id number]  Date of admission: 11/10/2018 Delivering Provider: Donnamae Jude   Date of discharge: 11/13/2018  Admitting diagnosis: PREG Intrauterine pregnancy: [redacted]w[redacted]d    Secondary diagnosis:  Active Problems:   Diet controlled gestational diabetes mellitus in third trimester   Maternal morbid obesity, antepartum ([place]   LGA (large for gestational age) fetus affecting management of mother, third trimester   Severe preeclampsia   [redacted] weeks gestation of pregnancy morbid obesity  Additional problems: None     Discharge diagnosis: Term Pregnancy Delivered, Preeclampsia (severe) and GDM A1                                                                                                Post partum procedures:magnesium  Augmentation: AROM, Pitocin, Cytotec and Foley Balloon  Complications: None  Hospital course:  Induction of Labor With Vaginal Delivery   23y.o. yo G1P1001 at 383w3das admitted to the hospital 11/10/2018 for induction of labor.  Indication for induction: severe pre-E. Initial SVE closed/thick/hi. Patient had long induction course. Patient was induced on 10/5 for severe Pre-E secondary to blood pressures. She has had 8 Cytotec, Foley bulb and Pitocin with two Pit breaks. AROM with IUPC placement last night at 2230. MVU's adequate overnight. She has been 4cm since 0600 on 10/7 and despite made minimal cervical change overnight despite AROM and adequate MVU's. Decision made to proceed with cesarean section for arrest of dilation/failed induction. Membrane Rupture Time/Date: 7:00 PM ,11/12/2018   Intrapartum Procedures: Episiotomy: None [1]                                         Lacerations:  None [1]  Patient had delivery of a Viable infant.  Information for the patient's newborn:  McDailey, Alberson0[[id number]]    11/13/2018   Delivery time: 1:58  PM    Magnesium Sulfate received: Yes BMZ received: No Rhophylac:No MMR:No Transfusion:No  Physical exam  Vitals:   11/13/18 1232 11/13/18 1302 11/13/18 1309 11/13/18 1500  BP: 139/69 (!) 151/93    Pulse: 100 (!) 107    Resp:   16   Temp:   99.1 F (37.3 C) 98.3 F (36.8 C)  TempSrc:   Oral   SpO2:      Weight:      Height:       General: alert Lochia: appropriate Uterine Fundus: firm Incision: Dressing is clean, dry, and intact DVT Evaluation: No evidence of DVT seen on physical exam. Labs: Lab Results  Component Value Date   WBC 13.6 (H) 11/13/2018   HGB 11.6 (L) 11/13/2018   HCT 33.8 (L) 11/13/2018   MCV 89.4 11/13/2018   PLT 227 11/13/2018   CMP Latest Ref Rng & Units 11/13/2018  Glucose 70 - 99 mg/dL 85  BUN 6 - 20 mg/dL 9  Creatinine 0.44 - 1.00 mg/dL  0.81  Sodium 135 - 145 mmol/L 138  Potassium 3.5 - 5.1 mmol/L 3.9  Chloride 98 - 111 mmol/L 105  CO2 22 - 32 mmol/L 22  Calcium 8.9 - 10.3 mg/dL 8.3(L)  Total Protein 6.5 - 8.1 g/dL 5.7(L)  Total Bilirubin 0.3 - 1.2 mg/dL 0.3  Alkaline Phos 38 - 126 U/L 144(H)  AST 15 - 41 U/L 14(L)  ALT 0 - 44 U/L 10    Discharge instruction: per After Visit Summary and "Baby and Me Booklet".  After visit meds:    Diet: No evidence of DVT seen on physical exam.  Activity: Advance as tolerated. Pelvic rest for 6 weeks.   Outpatient follow up:4 weeks Follow up Appt: Future Appointments  Date Time Provider [place]  11/19/2018  2:00 PM [facility] NURSE [facility]   Follow up Visit:   Please schedule this patient for Postpartum visit in: 4 weeks with the following provider: Any provider For C/S patients schedule nurse incision check in weeks 2 weeks: yes High risk pregnancy complicated by: severe Pre-E and GDMA1 Delivery mode:  CS Anticipated Birth Control:  other/unsure PP Procedures needed: GTT, incision check, BP check  Schedule Integrated BH visit: no  She plans abstinence for  contraception.    Newborn Data: Live born female  Birth Weight: 7-8 APGAR: 25, 64  Newborn Delivery   Birth date/time: 11/13/2018 13:58:00 Delivery type: C-Section, Low Transverse Trial of labor: Yes C-section categorization: Primary      Baby Feeding: Bottle Disposition:home with mother   11/13/2018 Chauncey Mann, MD

## 2018-11-13 NOTE — Progress Notes (Signed)
Valerie Crane is a 23 y.o. G1P0 at [redacted]w[redacted]d.  Subjective: Comfortable since epidural redosed.   Objective: BP (!) 152/88   Pulse 97   Temp 98.4 F (36.9 C) (Oral)   Resp 18   Ht 5\' 8"  (1.727 m)   Wt 134 kg   LMP 02/24/2018 (Exact Date)   SpO2 99%   BMI 44.92 kg/m    FHT:  FHR: 125 bpm, variability: mid,  accelerations:  15x15,  decelerations:  none UC:   Q 2-3 minutes, mod-strong  MVU >200 since ~0200 Dilation: 5 Effacement (%): 70 Cervical Position: Middle Station: -2 Presentation: Vertex Exam by:: v. Kayzen Kendzierski,cnm  Labs: Results for orders placed or performed during the hospital encounter of 11/10/18 (from the past 24 hour(s))  Glucose, capillary     Status: Abnormal   Collection Time: 11/12/18 11:30 AM  Result Value Ref Range   Glucose-Capillary 114 (H) 70 - 99 mg/dL  CBC     Status: Abnormal   Collection Time: 11/12/18  5:12 PM  Result Value Ref Range   WBC 9.8 4.0 - 10.5 K/uL   RBC 3.57 (L) 3.87 - 5.11 MIL/uL   Hemoglobin 11.1 (L) 12.0 - 15.0 g/dL   HCT 01/12/19 (L) 01.7 - 49.4 %   MCV 89.9 80.0 - 100.0 fL   MCH 31.1 26.0 - 34.0 pg   MCHC 34.6 30.0 - 36.0 g/dL   RDW 49.6 75.9 - 16.3 %   Platelets 239 150 - 400 K/uL   nRBC 0.0 0.0 - 0.2 %  Glucose, capillary     Status: None   Collection Time: 11/12/18  7:16 PM  Result Value Ref Range   Glucose-Capillary 97 70 - 99 mg/dL  Glucose, capillary     Status: None   Collection Time: 11/12/18 11:42 PM  Result Value Ref Range   Glucose-Capillary 76 70 - 99 mg/dL  Glucose, capillary     Status: None   Collection Time: 11/13/18  3:50 AM  Result Value Ref Range   Glucose-Capillary 81 70 - 99 mg/dL  Comprehensive metabolic panel     Status: Abnormal   Collection Time: 11/13/18  6:34 AM  Result Value Ref Range   Sodium 138 135 - 145 mmol/L   Potassium 3.9 3.5 - 5.1 mmol/L   Chloride 105 98 - 111 mmol/L   CO2 22 22 - 32 mmol/L   Glucose, Bld 85 70 - 99 mg/dL   BUN 9 6 - 20 mg/dL   Creatinine, Ser 01/13/19 0.44 - 1.00  mg/dL   Calcium 8.3 (L) 8.9 - 10.3 mg/dL   Total Protein 5.7 (L) 6.5 - 8.1 g/dL   Albumin 2.3 (L) 3.5 - 5.0 g/dL   AST 14 (L) 15 - 41 U/L   ALT 10 0 - 44 U/L   Alkaline Phosphatase 144 (H) 38 - 126 U/L   Total Bilirubin 0.3 0.3 - 1.2 mg/dL   GFR calc non Af Amer >60 >60 mL/min   GFR calc Af Amer >60 >60 mL/min   Anion gap 11 5 - 15  Glucose, capillary     Status: None   Collection Time: 11/13/18  8:47 AM  Result Value Ref Range   Glucose-Capillary 86 70 - 99 mg/dL    Assessment / Plan: [redacted]w[redacted]d week IUP Labor: Protracted latent phase Fetal Wellbeing:  Category I Pain Control:  Epidural Anticipated MOD:  W/ adequate labor x 9 hours, ROM x 12 hours, pitocin x 4 hours, minimal cervix change x 12  hours and no change x 4 hours concern for CPD. Pt open to C/S. Will discuss w/ Dr. Kennon Rounds.   Tamala Julian, [place], [place] 11/13/2018 11:19 AM

## 2018-11-13 NOTE — Transfer of Care (Signed)
Immediate Anesthesia Transfer of Care Note  Patient: Valerie Crane  Procedure(s) Performed: CESAREAN SECTION (N/A )  Patient Location: PACU  Anesthesia Type:Epidural  Level of Consciousness: awake, alert  and patient cooperative  Airway & Oxygen Therapy: Patient Spontanous Breathing  Post-op Assessment: Report given to RN and Post -op Vital signs reviewed and stable  Post vital signs: Reviewed and stable  Last Vitals:  Vitals Value Taken Time  BP 130/88 11/13/18 1500  Temp 36.8 C 11/13/18 1500  Pulse 116 11/13/18 1501  Resp 20 11/13/18 1501  SpO2 96 % 11/13/18 1501  Vitals shown include unvalidated device data.  Last Pain:  Vitals:   11/13/18 1309  TempSrc: Oral  PainSc:          Complications: No apparent anesthesia complications

## 2018-11-13 NOTE — Anesthesia Postprocedure Evaluation (Signed)
Anesthesia Post Note  Patient: Valerie Crane  Procedure(s) Performed: CESAREAN SECTION (N/A )     Patient location during evaluation: Mother Baby Anesthesia Type: Epidural Level of consciousness: oriented and awake and alert Pain management: pain level controlled Vital Signs Assessment: post-procedure vital signs reviewed and stable Respiratory status: spontaneous breathing and respiratory function stable Cardiovascular status: blood pressure returned to baseline and stable Postop Assessment: no headache, no backache, no apparent nausea or vomiting and able to ambulate Anesthetic complications: no    Last Vitals:  Vitals:   11/13/18 1530 11/13/18 1545  BP: 120/81 136/89  Pulse: 98 90  Resp: (!) 21 (!) 21  Temp:    SpO2: 97% 94%    Last Pain:  Vitals:   11/13/18 1545  TempSrc:   PainSc: 5    Pain Goal:    LLE Motor Response: Purposeful movement (11/13/18 1545)   RLE Motor Response: Purposeful movement (11/13/18 1545)       Epidural/Spinal Function Cutaneous sensation: Tingles (11/13/18 1530), Patient able to flex knees: Yes (11/13/18 1530), Patient able to lift hips off bed: Yes (11/13/18 1530), Back pain beyond tenderness at insertion site: No (11/13/18 1530), Progressively worsening motor and/or sensory loss: No (11/13/18 1530), Bowel and/or bladder incontinence post epidural: No (11/13/18 1530)  Barnet Glasgow

## 2018-11-14 LAB — CBC
HCT: 27.7 % — ABNORMAL LOW (ref 36.0–46.0)
Hemoglobin: 9 g/dL — ABNORMAL LOW (ref 12.0–15.0)
MCH: 29.7 pg (ref 26.0–34.0)
MCHC: 32.5 g/dL (ref 30.0–36.0)
MCV: 91.4 fL (ref 80.0–100.0)
Platelets: 236 10*3/uL (ref 150–400)
RBC: 3.03 MIL/uL — ABNORMAL LOW (ref 3.87–5.11)
RDW: 13.8 % (ref 11.5–15.5)
WBC: 11.8 10*3/uL — ABNORMAL HIGH (ref 4.0–10.5)
nRBC: 0 % (ref 0.0–0.2)

## 2018-11-14 LAB — COMPREHENSIVE METABOLIC PANEL
ALT: 11 U/L (ref 0–44)
AST: 19 U/L (ref 15–41)
Albumin: 2.1 g/dL — ABNORMAL LOW (ref 3.5–5.0)
Alkaline Phosphatase: 111 U/L (ref 38–126)
Anion gap: 9 (ref 5–15)
BUN: 11 mg/dL (ref 6–20)
CO2: 22 mmol/L (ref 22–32)
Calcium: 6.9 mg/dL — ABNORMAL LOW (ref 8.9–10.3)
Chloride: 104 mmol/L (ref 98–111)
Creatinine, Ser: 0.82 mg/dL (ref 0.44–1.00)
GFR calc Af Amer: >60 mL/min (ref >60)
GFR calc non Af Amer: >60 mL/min (ref >60)
Glucose, Bld: 99 mg/dL (ref 70–99)
Potassium: 4.2 mmol/L (ref 3.5–5.1)
Sodium: 135 mmol/L (ref 135–145)
Total Bilirubin: <0.1 mg/dL — ABNORMAL LOW (ref 0.3–1.2)
Total Protein: 4.9 g/dL — ABNORMAL LOW (ref 6.5–8.1)

## 2018-11-14 LAB — GLUCOSE, CAPILLARY: Glucose-Capillary: 102 mg/dL — ABNORMAL HIGH (ref 70–99)

## 2018-11-14 LAB — MAGNESIUM: Magnesium: 5.2 mg/dL — ABNORMAL HIGH (ref 1.7–2.4)

## 2018-11-14 NOTE — Progress Notes (Signed)
Faculty Attending Note  Post Op Day 1  Subjective: Patient is feeling well, sore with ambulating. She reports moderately well controlled pain on PO pain meds. She is ambulating and denies light-headedness or dizziness. She is due to void as foley catheter came out recently. She is not passing flatus, has not had a BM yet. She is tolerating a regular diet without nausea/vomiting. Bleeding is moderate. She is breast & bottle feeding. Baby is in room and doing well.  Objective: Blood pressure (!) 144/94, pulse 95, temperature 98.2 F (36.8 C), temperature source Oral, resp. rate 12, height 5\' 8"  (1.727 m), weight 134 kg, last menstrual period 02/24/2018, SpO2 98 %, unknown if currently breastfeeding. Temp:  [97.8 F (36.6 C)-99.1 F (37.3 C)] 98.2 F (36.8 C) (10/09 0742) Pulse Rate:  [89-118] 95 (10/09 0742) Resp:  [10-29] 12 (10/09 0742) BP: (111-159)/(69-96) 144/94 (10/09 0742) SpO2:  [90 %-99 %] 98 % (10/09 0740)  Physical Exam:  General: alert, oriented, cooperative Chest: normal respiratory effort Heart: RRR  Abdomen: soft, appropriately tender to palpation, incision covered by dressing with no evidence of active bleeding  Uterine Fundus: firm, 2 fingers below the umbilicus Lochia: moderate, rubra DVT Evaluation: no evidence of DVT Extremities: no edema, no calf tenderness  UOP: due to void, foley recently out  Recent Labs    11/13/18 1625 11/14/18 0611  HGB 10.9* 9.0*  HCT 34.2* 27.7*    Assessment/Plan: Patient Active Problem List   Diagnosis Date Noted  . [redacted] weeks gestation of pregnancy 11/13/2018  . Severe preeclampsia 11/12/2018  . LGA (large for gestational age) fetus affecting management of mother, third trimester 10/15/2018  . Maternal morbid obesity, antepartum ([place]) 10/09/2018  . Diet controlled gestational diabetes mellitus in third trimester 09/24/2018  . Trichomonal vaginitis during pregnancy in first trimester 06/26/2018  . Excessive weight gain during  pregnancy in third trimester 05/12/2018  . Supervision of high-risk pregnancy 05/08/2018    Patient is 23 y.o. G1P1001 POD#1 s/p 1LTCS at [redacted]w[redacted]d for arrest of dilation after induction of labor for severe pre-eclampsia. Course complicated by protracted labor/induction course. She is doing well, recovering appropriately and complains only of some pain with prolonged standing. Due to void   Continue routine post partum care Pain meds prn Regular diet Will need pp 2 hr GTT for gDMA1 Lovenox 40 mg daily [place] undecided for birth control Cont mag until 24 hrs pp, d/c this afternoon    K. Arvilla Meres, M.D. Attending Center for [employer] (Faculty Practice)  11/14/2018, 10:37 AM

## 2018-11-14 NOTE — Anesthesia Postprocedure Evaluation (Signed)
Anesthesia Post Note  Patient: Valerie Crane  Procedure(s) Performed: CESAREAN SECTION (N/A )     Patient location during evaluation: OB High Risk Anesthesia Type: Epidural Level of consciousness: awake and alert and oriented Pain management: pain level controlled Vital Signs Assessment: post-procedure vital signs reviewed and stable Respiratory status: spontaneous breathing Cardiovascular status: stable Postop Assessment: no headache, adequate PO intake, no backache, patient able to bend at knees, able to ambulate and epidural receding Anesthetic complications: no    Last Vitals:  Vitals:   11/14/18 0740 11/14/18 0742  BP:  (!) 144/94  Pulse:  95  Resp:  12  Temp:  36.8 C  SpO2: 98%     Last Pain:  Vitals:   11/14/18 0742  TempSrc: Oral  PainSc:    Pain Goal: Patients Stated Pain Goal: 3 (11/13/18 1912)              Epidural/Spinal Function Cutaneous sensation: Normal sensation (11/14/18 0730), Patient able to flex knees: Yes (11/14/18 0730), Patient able to lift hips off bed: Yes (11/14/18 0730), Back pain beyond tenderness at insertion site: No (11/14/18 0730), Progressively worsening motor and/or sensory loss: No (11/14/18 0730), Bowel and/or bladder incontinence post epidural: No (11/14/18 0730)  Ailene Ards

## 2018-11-14 NOTE — Progress Notes (Signed)
Patient instructed not to fall asleep with baby in bed with her.  Baby placed in bassinet.

## 2018-11-15 MED ORDER — FUROSEMIDE 20 MG PO TABS
10.0000 mg | ORAL_TABLET | Freq: Two times a day (BID) | ORAL | Status: AC
  Administered 2018-11-15 (×2): 10 mg via ORAL
  Filled 2018-11-15 (×2): qty 0.5

## 2018-11-15 MED ORDER — AMLODIPINE BESYLATE 10 MG PO TABS
10.0000 mg | ORAL_TABLET | Freq: Every day | ORAL | Status: DC
  Administered 2018-11-15 – 2018-11-16 (×3): 10 mg via ORAL
  Filled 2018-11-15 (×3): qty 1

## 2018-11-15 NOTE — Plan of Care (Signed)
  Problem: Education: Goal: Knowledge of General Education information will improve Description: Including pain rating scale, medication(s)/side effects and non-pharmacologic comfort measures Outcome: Completed/Met   Problem: Clinical Measurements: Goal: Respiratory complications will improve Outcome: Completed/Met Goal: Cardiovascular complication will be avoided Outcome: Completed/Met   Problem: Activity: Goal: Risk for activity intolerance will decrease Outcome: Completed/Met   Problem: Nutrition: Goal: Adequate nutrition will be maintained Outcome: Completed/Met   Problem: Coping: Goal: Level of anxiety will decrease Outcome: Completed/Met   Problem: Elimination: Goal: Will not experience complications related to bowel motility Outcome: Completed/Met Goal: Will not experience complications related to urinary retention Outcome: Completed/Met   Problem: Education: Goal: Knowledge of condition will improve Outcome: Completed/Met   Problem: Activity: Goal: Will verbalize the importance of balancing activity with adequate rest periods Outcome: Completed/Met Goal: Ability to tolerate increased activity will improve Outcome: Completed/Met   Problem: Life Cycle: Goal: Chance of risk for complications during the postpartum period will decrease Outcome: Completed/Met   Problem: Role Relationship: Goal: Ability to demonstrate positive interaction with newborn will improve Outcome: Completed/Met

## 2018-11-15 NOTE — Progress Notes (Signed)
Subjective: Postpartum Day 2: Cesarean Delivery Patient has no complaints this morning. Denies HA or visual changes. Pain controlled. Ambulating and voiding without problems. Bottle feeding  Objective: Vital signs in last 24 hours: Temp:  [97.7 F (36.5 C)-98.6 F (37 C)] 98.2 F (36.8 C) (10/10 0355) Pulse Rate:  [91-101] 101 (10/10 0355) Resp:  [12-20] 20 (10/10 0355) BP: (123-152)/(70-100) 149/93 (10/10 0355) SpO2:  [95 %-100 %] 98 % (10/10 0355)  Physical Exam:  General: alert Lochia: appropriate Uterine Fundus: firm Incision: healing well DVT Evaluation: No evidence of DVT seen on physical exam.  Recent Labs    11/13/18 1625 11/14/18 0611  HGB 10.9* 9.0*  HCT 34.2* 27.7*    Assessment/Plan: POD # @ LTCS SPEC s/p magnesium for 24 hrs  Stable. BP slightly elevated. Will start Norvasc. Continue with supportive care  Chancy Milroy 11/15/2018, 7:00 AM

## 2018-11-16 MED ORDER — ACETAMINOPHEN 325 MG PO TABS: 650.0000 mg | ORAL_TABLET | Freq: Four times a day (QID) | ORAL | 3 refills | Status: DC | PRN

## 2018-11-16 MED ORDER — AMLODIPINE BESYLATE 10 MG PO TABS: 10.0000 mg | ORAL_TABLET | Freq: Every day | ORAL | 3 refills | Status: DC

## 2018-11-16 MED ORDER — IBUPROFEN 600 MG PO TABS: 600.0000 mg | ORAL_TABLET | Freq: Four times a day (QID) | ORAL | 0 refills | Status: DC | PRN

## 2018-11-16 MED ORDER — OXYCODONE HCL 5 MG PO TABS: 5.0000 mg | ORAL_TABLET | ORAL | 0 refills | Status: DC | PRN

## 2018-11-16 NOTE — Discharge Instructions (Signed)

## 2018-11-17 LAB — GLUCOSE, CAPILLARY: Glucose-Capillary: 105 mg/dL — ABNORMAL HIGH (ref 70–99)

## 2018-11-19 ENCOUNTER — Other Ambulatory Visit: Payer: Self-pay

## 2018-11-19 ENCOUNTER — Telehealth: Payer: BC Managed Care – PPO

## 2018-11-20 ENCOUNTER — Ambulatory Visit: Payer: BC Managed Care – PPO | Admitting: *Deleted

## 2018-11-20 VITALS — BP 133/83 | HR 106 | Wt 266.0 lb

## 2018-11-20 DIAGNOSIS — O1493 Unspecified pre-eclampsia, third trimester: Secondary | ICD-10-CM

## 2018-11-20 NOTE — Progress Notes (Signed)
Pt here today for a BP and incision check, had a cesarean section on 11/13/2018.   BP in office today 133/83. Honeycomb removed, incision intact, no drainage or bleeding noted. Pt informed to let steri strips fall off on their own and to not scrub the area.   Pt to follow up at postpartum visit.   Crosby Oyster, RN

## 2018-11-20 NOTE — Progress Notes (Signed)
Patient seen and assessed by nursing staff during this encounter. I have reviewed the chart and agree with the documentation and plan.  Verita Schneiders, MD 11/20/2018 12:07 PM

## 2018-12-25 ENCOUNTER — Ambulatory Visit: Payer: BC Managed Care – PPO | Admitting: Obstetrics and Gynecology

## 2018-12-30 ENCOUNTER — Other Ambulatory Visit: Payer: BC Managed Care – PPO

## 2019-01-14 ENCOUNTER — Ambulatory Visit: Payer: BC Managed Care – PPO | Admitting: Advanced Practice Midwife

## 2019-01-14 NOTE — Progress Notes (Deleted)
[place] Partum Exam  Valerie Crane is a 23 y.o. G53P1001 female who presents for a postpartum visit. She is {1-10:13787} {time; units:18646} postpartum following a {delivery:12449}. I have fully reviewed the prenatal and intrapartum course. The delivery was at *** gestational weeks.  Anesthesia: {anesthesia types:812}. Postpartum course has been ***. Baby's course has been ***. Baby is feeding by {breast/bottle:69}. Bleeding {vag bleed:12292}. Bowel function is {normal:32111}. Bladder function is {normal:32111}. Patient {is/is not:9024} sexually active. Contraception method is {contraceptive method:5051}. Postpartum depression screening:neg  {Common ambulatory SmartLinks:19316} Last pap smear done *** and was {Desc; normal/abnormal:11317::"Normal"}  Review of Systems {ros; complete:30496}    Objective:  Last menstrual period 02/24/2018, unknown if currently breastfeeding.  General:  {gen appearance:16600}   Breasts:  {breast exam:1202::"inspection negative, no nipple discharge or bleeding, no masses or nodularity palpable"}  Lungs: {lung exam:16931}  Heart:  {heart exam:5510}  Abdomen: {abdomen exam:16834}   Vulva:  {labia exam:12198}  Vagina: {vagina exam:12200}  Cervix:  {cervix exam:14595}  Corpus: {uterus exam:12215}  Adnexa:  {adnexa exam:12223}  Rectal Exam: {rectal/vaginal exam:12274}        Assessment:    *** postpartum exam. Pap smear {done:10129} at today's visit.   Plan:   1. Contraception: {method:5051} 2. *** 3. Follow up in: {1-10:13787} {time; units:19136} or as needed.

## 2019-02-08 ENCOUNTER — Other Ambulatory Visit: Payer: Self-pay | Admitting: Obstetrics & Gynecology

## 2019-03-02 ENCOUNTER — Other Ambulatory Visit: Payer: Self-pay

## 2019-03-02 ENCOUNTER — Emergency Department (HOSPITAL_COMMUNITY): Payer: BC Managed Care – PPO

## 2019-03-02 ENCOUNTER — Encounter (HOSPITAL_COMMUNITY): Payer: Self-pay | Admitting: Anesthesiology

## 2019-03-02 ENCOUNTER — Encounter (HOSPITAL_COMMUNITY): Admission: EM | Disposition: A | Discharge status: Home / Self Care | Payer: Self-pay | Source: Home / Self Care

## 2019-03-02 ENCOUNTER — Encounter (HOSPITAL_COMMUNITY): Payer: Self-pay | Admitting: Emergency Medicine

## 2019-03-02 ENCOUNTER — Inpatient Hospital Stay (HOSPITAL_COMMUNITY)
Admission: EM | Admit: 2019-03-02 | Discharge: 2019-03-04 | DRG: 393 | Disposition: A | Discharge status: Home / Self Care | Payer: BC Managed Care – PPO | Attending: General Surgery | Admitting: General Surgery

## 2019-03-02 DIAGNOSIS — Z98891 History of uterine scar from previous surgery: Secondary | ICD-10-CM

## 2019-03-02 DIAGNOSIS — K3580 Unspecified acute appendicitis: Secondary | ICD-10-CM | POA: Diagnosis not present

## 2019-03-02 DIAGNOSIS — Z8632 Personal history of gestational diabetes: Secondary | ICD-10-CM

## 2019-03-02 DIAGNOSIS — Z833 Family history of diabetes mellitus: Secondary | ICD-10-CM | POA: Diagnosis not present

## 2019-03-02 DIAGNOSIS — Z8616 Personal history of COVID-19: Secondary | ICD-10-CM

## 2019-03-02 DIAGNOSIS — R109 Unspecified abdominal pain: Secondary | ICD-10-CM | POA: Diagnosis not present

## 2019-03-02 DIAGNOSIS — I10 Essential (primary) hypertension: Secondary | ICD-10-CM | POA: Diagnosis present

## 2019-03-02 DIAGNOSIS — E669 Obesity, unspecified: Secondary | ICD-10-CM | POA: Diagnosis present

## 2019-03-02 DIAGNOSIS — R1084 Generalized abdominal pain: Secondary | ICD-10-CM | POA: Diagnosis present

## 2019-03-02 DIAGNOSIS — K37 Unspecified appendicitis: Secondary | ICD-10-CM | POA: Diagnosis present

## 2019-03-02 DIAGNOSIS — Z79899 Other long term (current) drug therapy: Secondary | ICD-10-CM

## 2019-03-02 DIAGNOSIS — U071 COVID-19: Secondary | ICD-10-CM | POA: Diagnosis not present

## 2019-03-02 DIAGNOSIS — K5730 Diverticulosis of large intestine without perforation or abscess without bleeding: Secondary | ICD-10-CM | POA: Diagnosis present

## 2019-03-02 DIAGNOSIS — Z87891 Personal history of nicotine dependence: Secondary | ICD-10-CM

## 2019-03-02 DIAGNOSIS — Z6841 Body Mass Index (BMI) 40.0 and over, adult: Secondary | ICD-10-CM

## 2019-03-02 HISTORY — DX: Personal history of COVID-19: Z86.16

## 2019-03-02 LAB — I-STAT BETA HCG BLOOD, ED (MC, WL, AP ONLY): I-stat hCG, quantitative: <5 m[IU]/mL (ref <5)

## 2019-03-02 LAB — CBC
HCT: 42.1 % (ref 36.0–46.0)
Hemoglobin: 13.3 g/dL (ref 12.0–15.0)
MCH: 26.8 pg (ref 26.0–34.0)
MCHC: 31.6 g/dL (ref 30.0–36.0)
MCV: 84.7 fL (ref 80.0–100.0)
Platelets: 323 10*3/uL (ref 150–400)
RBC: 4.97 MIL/uL (ref 3.87–5.11)
RDW: 16.1 % — ABNORMAL HIGH (ref 11.5–15.5)
WBC: 13.6 10*3/uL — ABNORMAL HIGH (ref 4.0–10.5)
nRBC: 0 % (ref 0.0–0.2)

## 2019-03-02 LAB — URINALYSIS, ROUTINE W REFLEX MICROSCOPIC
Bacteria, UA: NONE SEEN
Bilirubin Urine: NEGATIVE
Glucose, UA: NEGATIVE mg/dL
Hgb urine dipstick: NEGATIVE
Ketones, ur: 5 mg/dL — AB
Leukocytes,Ua: NEGATIVE
Nitrite: NEGATIVE
Protein, ur: 30 mg/dL — AB
Specific Gravity, Urine: 1.024 (ref 1.005–1.030)
pH: 6 (ref 5.0–8.0)

## 2019-03-02 LAB — COMPREHENSIVE METABOLIC PANEL
ALT: 34 U/L (ref 0–44)
AST: 26 U/L (ref 15–41)
Albumin: 4.2 g/dL (ref 3.5–5.0)
Alkaline Phosphatase: 71 U/L (ref 38–126)
Anion gap: 16 — ABNORMAL HIGH (ref 5–15)
BUN: 11 mg/dL (ref 6–20)
CO2: 20 mmol/L — ABNORMAL LOW (ref 22–32)
Calcium: 9.1 mg/dL (ref 8.9–10.3)
Chloride: 103 mmol/L (ref 98–111)
Creatinine, Ser: 0.87 mg/dL (ref 0.44–1.00)
GFR calc Af Amer: >60 mL/min (ref >60)
GFR calc non Af Amer: >60 mL/min (ref >60)
Glucose, Bld: 132 mg/dL — ABNORMAL HIGH (ref 70–99)
Potassium: 3.4 mmol/L — ABNORMAL LOW (ref 3.5–5.1)
Sodium: 139 mmol/L (ref 135–145)
Total Bilirubin: 0.6 mg/dL (ref 0.3–1.2)
Total Protein: 7.7 g/dL (ref 6.5–8.1)

## 2019-03-02 LAB — HCG, QUANTITATIVE, PREGNANCY: hCG, Beta Chain, Quant, S: 1 m[IU]/mL (ref <5)

## 2019-03-02 LAB — LIPASE, BLOOD: Lipase: 20 U/L (ref 11–51)

## 2019-03-02 LAB — RESPIRATORY PANEL BY RT PCR (FLU A&B, COVID)
Influenza A by PCR: NEGATIVE
Influenza B by PCR: NEGATIVE
SARS Coronavirus 2 by RT PCR: POSITIVE — AB

## 2019-03-02 SURGERY — APPENDECTOMY, LAPAROSCOPIC
Anesthesia: Choice

## 2019-03-02 MED ORDER — MORPHINE SULFATE (PF) 4 MG/ML IV SOLN
4.0000 mg | INTRAVENOUS | Status: DC | PRN
  Administered 2019-03-02 – 2019-03-03 (×4): 4 mg via INTRAVENOUS
  Filled 2019-03-02 (×4): qty 1

## 2019-03-02 MED ORDER — ONDANSETRON 4 MG PO TBDP
4.0000 mg | ORAL_TABLET | Freq: Four times a day (QID) | ORAL | Status: DC | PRN
  Administered 2019-03-02: 4 mg via ORAL
  Filled 2019-03-02: qty 1

## 2019-03-02 MED ORDER — SODIUM CHLORIDE 0.9 % IV SOLN: 2.0000 g | INTRAVENOUS | Status: DC

## 2019-03-02 MED ORDER — ZOLPIDEM TARTRATE 5 MG PO TABS
5.0000 mg | ORAL_TABLET | Freq: Every evening | ORAL | Status: DC | PRN
  Administered 2019-03-02: 5 mg via ORAL
  Filled 2019-03-02: qty 1

## 2019-03-02 MED ORDER — POTASSIUM CHLORIDE 2 MEQ/ML IV SOLN
INTRAVENOUS | Status: DC
  Filled 2019-03-02 (×2): qty 1000

## 2019-03-02 MED ORDER — IOHEXOL 300 MG/ML  SOLN
100.0000 mL | Freq: Once | INTRAMUSCULAR | Status: AC | PRN
  Administered 2019-03-02: 100 mL via INTRAVENOUS

## 2019-03-02 MED ORDER — FENTANYL CITRATE (PF) 100 MCG/2ML IJ SOLN
100.0000 ug | INTRAMUSCULAR | Status: AC | PRN
  Administered 2019-03-02 (×2): 100 ug via INTRAVENOUS
  Filled 2019-03-02 (×2): qty 2

## 2019-03-02 MED ORDER — DIPHENHYDRAMINE HCL 50 MG/ML IJ SOLN: 25.0000 mg | Freq: Four times a day (QID) | INTRAMUSCULAR | Status: DC | PRN

## 2019-03-02 MED ORDER — OXYCODONE HCL 5 MG PO TABS
5.0000 mg | ORAL_TABLET | ORAL | Status: DC | PRN
  Administered 2019-03-02 – 2019-03-04 (×6): 10 mg via ORAL
  Filled 2019-03-02 (×6): qty 2

## 2019-03-02 MED ORDER — ONDANSETRON HCL 4 MG/2ML IJ SOLN: 4.0000 mg | Freq: Four times a day (QID) | INTRAMUSCULAR | Status: DC | PRN

## 2019-03-02 MED ORDER — METHOCARBAMOL 500 MG PO TABS: 500.0000 mg | ORAL_TABLET | Freq: Four times a day (QID) | ORAL | Status: DC | PRN

## 2019-03-02 MED ORDER — METRONIDAZOLE IN NACL 5-0.79 MG/ML-% IV SOLN
500.0000 mg | Freq: Three times a day (TID) | INTRAVENOUS | Status: DC
  Filled 2019-03-02: qty 100

## 2019-03-02 MED ORDER — DIPHENHYDRAMINE HCL 25 MG PO CAPS: 25.0000 mg | ORAL_CAPSULE | Freq: Four times a day (QID) | ORAL | Status: DC | PRN

## 2019-03-02 MED ORDER — SODIUM CHLORIDE 0.9 % IV SOLN
2.0000 g | INTRAVENOUS | Status: DC
  Administered 2019-03-02 – 2019-03-03 (×2): 2 g via INTRAVENOUS
  Filled 2019-03-02 (×2): qty 20

## 2019-03-02 MED ORDER — ONDANSETRON HCL 4 MG/2ML IJ SOLN
4.0000 mg | Freq: Once | INTRAMUSCULAR | Status: AC
  Administered 2019-03-02: 4 mg via INTRAVENOUS
  Filled 2019-03-02: qty 2

## 2019-03-02 MED ORDER — ACETAMINOPHEN 650 MG RE SUPP: 650.0000 mg | Freq: Four times a day (QID) | RECTAL | Status: DC | PRN

## 2019-03-02 MED ORDER — SODIUM CHLORIDE 0.9 % IV BOLUS
1000.0000 mL | Freq: Once | INTRAVENOUS | Status: AC
  Administered 2019-03-02: 1000 mL via INTRAVENOUS

## 2019-03-02 MED ORDER — POTASSIUM CHLORIDE IN NACL 20-0.9 MEQ/L-% IV SOLN
INTRAVENOUS | Status: DC
  Filled 2019-03-02 (×4): qty 1000

## 2019-03-02 MED ORDER — ACETAMINOPHEN 325 MG PO TABS
650.0000 mg | ORAL_TABLET | Freq: Four times a day (QID) | ORAL | Status: DC | PRN
  Administered 2019-03-03 (×2): 650 mg via ORAL
  Filled 2019-03-02 (×2): qty 2

## 2019-03-02 MED ORDER — SODIUM CHLORIDE 0.9% FLUSH: 3.0000 mL | Freq: Once | INTRAVENOUS | Status: DC

## 2019-03-02 MED ORDER — PANTOPRAZOLE SODIUM 40 MG IV SOLR
40.0000 mg | Freq: Every day | INTRAVENOUS | Status: DC
  Administered 2019-03-02 – 2019-03-03 (×2): 40 mg via INTRAVENOUS
  Filled 2019-03-02 (×2): qty 40

## 2019-03-02 MED ORDER — METRONIDAZOLE IN NACL 5-0.79 MG/ML-% IV SOLN
500.0000 mg | Freq: Three times a day (TID) | INTRAVENOUS | Status: DC
  Administered 2019-03-02 – 2019-03-03 (×5): 500 mg via INTRAVENOUS
  Filled 2019-03-02 (×4): qty 100

## 2019-03-02 NOTE — Progress Notes (Signed)
Pt has arrived to the floor.  Alert, oriented, in pain.  Called family to ask them to bring her charger.

## 2019-03-02 NOTE — ED Notes (Signed)
Pt taken to CT.

## 2019-03-02 NOTE — ED Notes (Signed)
Pt states she is 3 mo's postpartum, LMP 1 mo ago. Pain is in upper abdomen, radiates generally and is tender in RLQ. States n/v since yesterday, feels constipated but last BM yesterday.

## 2019-03-02 NOTE — H&P (Signed)
[facility] H&P  Akari Karrie Doffing 08/24/1995  [id number].    Requesting MD: Gray Bernhardt Chief Complaint:  Abdominal pain Reason for Consult: Acute appendicitis  HPI:  Patient is a 24 year old female who developed abdominal pain yesterday.  She reports receiving the Covid vaccine on 02/28/2019.  She says she has had sharp abdominal pain since yesterday that started generalized but then localized to the RLQ associated with diarrhea, and vomiting. She vomited again on the way here. She denied fevers chills, cough, shortness of breath.  She works as a Agricultural engineer.  Work-up in the ED: She is afebrile, vital signs are stable.  Temp is 99.8.  Labs show a potassium of 3.4, CO2 of 20, glucose of 132, anion gap of 16.  Creatinine 0.87.  LFTs are normal lipase is 20.  WBC 13.6, hemoglobin 13.3, hematocrit 42, platelets are 323,000.  Urinalysis unremarkable.  HCG<5.0.  CT of the abdomen with contrast there is the appendix is measuring 1.4 cm in diameter.  There is soft tissue stranding along the course of the appendix.  The wall appendix mildly enhancing diffusely.  There is no.  Appendiceal region of abscess or perforation.  This is consistent with acute appendicitis.  There was some free fluid in the cul-de-sac.Marland Kitchen  Scattered colonic diverticuli without diverticulitis.  We have been called to see.  ROS: Review of Systems  Constitutional: Negative for chills and fever.  HENT: Negative.   Eyes: Negative.   Respiratory: Negative for cough and shortness of breath.   Cardiovascular: Negative for chest pain.  Gastrointestinal: Positive for abdominal pain, nausea and vomiting.  Genitourinary: Negative.   Musculoskeletal: Negative.   Skin: Negative.   Neurological: Negative.   Endo/Heme/Allergies: Negative.   Psychiatric/Behavioral: Negative.     Family History  Problem Relation Age of Onset  . Diabetes Mother     Past Medical History:  Diagnosis Date  . Medical history  non-contributory     Past Surgical History:  Procedure Laterality Date  . CESAREAN SECTION N/A 11/13/2018   Procedure: CESAREAN SECTION;  Surgeon: Reva Bores, MD;  Location: [facility] LD ORS;  Service: Obstetrics;  Laterality: N/A;  . NO PAST SURGERIES      Social History:  reports that she quit smoking about a year ago. Her smoking use included cigarettes. She smoked 0.50 packs per day. She has never used smokeless tobacco. She reports previous drug use. Drug: Marijuana. She reports that she does not drink alcohol.  Allergies: No Known Allergies  Prior to Admission medications   Medication Sig Start Date End Date Taking? Authorizing Provider  acetaminophen (TYLENOL) 325 MG tablet Take 2 tablets (650 mg total) by mouth every 6 (six) hours as needed for mild pain (temperature > 101.5.). 11/16/18  Yes Dove, Myra C, MD  ibuprofen (ADVIL) 600 MG tablet Take 1 tablet (600 mg total) by mouth every 6 (six) hours as needed for fever or headache. 11/16/18  Yes Dove, Myra C, MD  amLODipine (NORVASC) 10 MG tablet TAKE 1 TABLET BY MOUTH EVERY DAY 02/09/19   Nicholaus Bloom C, MD  oxyCODONE (OXY IR/ROXICODONE) 5 MG immediate release tablet Take 1-2 tablets (5-10 mg total) by mouth every 4 (four) hours as needed for moderate pain. Patient not taking: Reported on 03/02/2019 11/16/18   Allie Bossier, MD     Blood pressure 109/77, pulse 62, temperature 99.8 F (37.7 C), temperature source Oral, resp. rate 16, last menstrual period 01/25/2019, SpO2 98 %, unknown if currently breastfeeding. Physical  Exam: Physical Exam Constitutional:      Appearance: Normal appearance. She is obese. She is not toxic-appearing.  HENT:     Head: Normocephalic.     Nose: Nose normal.     Mouth/Throat:     Mouth: Mucous membranes are moist.  Eyes:     Pupils: Pupils are equal, round, and reactive to light.  Cardiovascular:     Rate and Rhythm: Normal rate and regular rhythm.     Pulses: Normal pulses.     Heart sounds: Normal  heart sounds.  Pulmonary:     Effort: Pulmonary effort is normal. No respiratory distress.     Breath sounds: No wheezing.  Abdominal:     General: Abdomen is flat. There is no distension.     Palpations: Abdomen is soft. There is no mass.     Tenderness: There is abdominal tenderness. There is no guarding.     Comments: Tender RLQ  Musculoskeletal:        General: No swelling or tenderness.     Cervical back: Normal range of motion.  Skin:    General: Skin is warm and dry.  Neurological:     Mental Status: She is alert and oriented to person, place, and time.  Psychiatric:        Mood and Affect: Mood normal.     Results for orders placed or performed during the hospital encounter of 03/02/19 (from the past 48 hour(s))  Lipase, blood     Status: None   Collection Time: 03/02/19  7:40 AM  Result Value Ref Range   Lipase 20 11 - 51 U/L    Comment: Performed at [facility], [address], [place], [place] [address]  Comprehensive metabolic panel     Status: Abnormal   Collection Time: 03/02/19  7:40 AM  Result Value Ref Range   Sodium 139 135 - 145 mmol/L   Potassium 3.4 (L) 3.5 - 5.1 mmol/L   Chloride 103 98 - 111 mmol/L   CO2 20 (L) 22 - 32 mmol/L   Glucose, Bld 132 (H) 70 - 99 mg/dL   BUN 11 6 - 20 mg/dL   Creatinine, Ser 7.51 0.44 - 1.00 mg/dL   Calcium 9.1 8.9 - 70.0 mg/dL   Total Protein 7.7 6.5 - 8.1 g/dL   Albumin 4.2 3.5 - 5.0 g/dL   AST 26 15 - 41 U/L   ALT 34 0 - 44 U/L   Alkaline Phosphatase 71 38 - 126 U/L   Total Bilirubin 0.6 0.3 - 1.2 mg/dL   GFR calc non Af Amer >60 >60 mL/min   GFR calc Af Amer >60 >60 mL/min   Anion gap 16 (H) 5 - 15    Comment: Performed at [facility], [address], [place], [place] [address]  CBC     Status: Abnormal   Collection Time: 03/02/19  7:40 AM  Result Value Ref Range   WBC 13.6 (H) 4.0 - 10.5 K/uL   RBC 4.97 3.87 - 5.11 MIL/uL   Hemoglobin 13.3 12.0 - 15.0 g/dL   HCT 49.6 75.9 - 16.3 %   MCV 84.7  80.0 - 100.0 fL   MCH 26.8 26.0 - 34.0 pg   MCHC 31.6 30.0 - 36.0 g/dL   RDW 84.6 (H) 65.9 - 93.5 %   Platelets 323 150 - 400 K/uL   nRBC 0.0 0.0 - 0.2 %    Comment: Performed at [facility], [address] [place], [place] [address]  I-Stat beta hCG blood, ED     Status: None   Collection Time: 03/02/19  9:40 AM  Result Value Ref Range   I-stat hCG, quantitative <5.0 <5 mIU/mL   Comment 3            Comment:   GEST. AGE      CONC.  (mIU/mL)   <=1 WEEK        5 - 50     2 WEEKS       50 - 500     3 WEEKS       100 - 10,000     4 WEEKS     1,000 - 30,000        FEMALE AND NON-PREGNANT FEMALE:     LESS THAN 5 mIU/mL   Urinalysis, Routine w reflex microscopic     Status: Abnormal   Collection Time: 03/02/19 11:23 AM  Result Value Ref Range   Color, Urine YELLOW YELLOW   APPearance HAZY (A) CLEAR   Specific Gravity, Urine 1.024 1.005 - 1.030   pH 6.0 5.0 - 8.0   Glucose, UA NEGATIVE NEGATIVE mg/dL   Hgb urine dipstick NEGATIVE NEGATIVE   Bilirubin Urine NEGATIVE NEGATIVE   Ketones, ur 5 (A) NEGATIVE mg/dL   Protein, ur 30 (A) NEGATIVE mg/dL   Nitrite NEGATIVE NEGATIVE   Leukocytes,Ua NEGATIVE NEGATIVE   RBC / HPF 0-5 0 - 5 RBC/hpf   WBC, UA 0-5 0 - 5 WBC/hpf   Bacteria, UA NONE SEEN NONE SEEN   Squamous Epithelial / LPF 6-10 0 - 5   Mucus PRESENT     Comment: Performed at [facility], [place] [address], [place], [place] [address]   CT Abdomen Pelvis W Contrast  Result Date: 03/02/2019 CLINICAL DATA:  Abdominal pain EXAM: CT ABDOMEN AND PELVIS WITH CONTRAST TECHNIQUE: Multidetector CT imaging of the abdomen and pelvis was performed using the standard protocol following bolus administration of intravenous contrast. CONTRAST:  13mL OMNIPAQUE IOHEXOL 300 MG/ML  SOLN COMPARISON:  None. FINDINGS: Lower chest: There is bibasilar atelectasis. Hepatobiliary: No focal liver lesions are appreciable. Gallbladder wall is not appreciably thickened. There is no biliary duct  dilatation. Pancreas: There is no pancreatic mass or inflammatory focus. Spleen: Spleen measures 14.8 x 13.2 x 7.7 cm with a measured splenic volume of 752 cubic cm. No focal splenic lesions are evident. Adrenals/Urinary Tract: Adrenals bilaterally appear normal. Kidneys bilaterally show no evident mass or hydronephrosis on either side. There is no evident renal or ureteral calculus on either side. Urinary bladder is midline with wall thickness within normal limits. Stomach/Bowel: There is no appreciable bowel wall or mesenteric thickening. There are scattered colonic diverticula without diverticulitis. The terminal ileum appears normal. No evident bowel obstruction. There is no free air or portal venous air. Vascular/Lymphatic: There is no abdominal aortic aneurysm. No vascular lesions are evident. Major venous structures are patent. There is no adenopathy in the abdomen or pelvis. Reproductive: Uterus is anteverted. There is no pelvic mass evident. There is free fluid in the cul-de-sac. Other: The appendix is distended, measuring 1.4 cm in diameter. There is soft tissue stranding along the course of the appendix. The wall of the appendix is mildly enhancing diffusely. There is no periappendiceal region abscess or perforation. No abscess evident in the abdomen or pelvis. Musculoskeletal: No blastic or lytic bone lesions are evident. There is no intramuscular or abdominal wall lesion. IMPRESSION: 1.  Evidence  of acute appendicitis. Appendix: Location: Appendix arises from the cecum inferomedially, tracking transversely in the right mid pelvis with the tip slightly to the right of midline in mid pelvic region. Diameter: 1.4 cm Appendicolith: None Mucosal hyper-enhancement: Present Extraluminal gas: None Periappendiceal collection: None. There is soft tissue stranding along the course of the appendix. 2. Free fluid in the cul-de-sac. This fluid may be of reactive etiology given the appendicitis or could represent  recent ovarian cyst rupture. 3. Scattered colonic diverticula without diverticulitis. No bowel obstruction. No abscess in the abdomen or pelvis. 4.  Splenomegaly of uncertain etiology.  No splenic lesions evident. 5. No renal or ureteral calculus. No hydronephrosis. Urinary bladder wall thickness normal. Critical Value/emergent results were called by telephone at the time of interpretation on 03/02/2019 at 2:11 pm to [facility], [place], who verbally acknowledged these results. Electronically Signed   By: Bretta Bang III M.D.   On: 03/02/2019 14:12      Assessment/Plan Hx preeclampsia Diet controlled gestational diabetes Obesity BMI 40.5 Hypertension  Acute appendicitis - NPO, IV Rocephin and Flagyl. COVID test pending. To OR tonight with Dr. Dwain Sarna for laparoscopic appendectomy. I discussed the procedure, risks, and benefits with her and she agrees. I discussed the possibility of D/C from PACU and she would like to do that if possible.   Violeta Gelinas, MD, MPH, FACS Please use [email or web address] to contact on call provider

## 2019-03-02 NOTE — Anesthesia Preprocedure Evaluation (Deleted)
Anesthesia Evaluation    Reviewed: Allergy & Precautions, Patient's Chart, lab work & pertinent test results  Airway        Dental   Pulmonary former smoker,           Cardiovascular hypertension, Pt. on medications      Neuro/Psych negative neurological ROS     GI/Hepatic negative GI ROS, Neg liver ROS,   Endo/Other  diabetes  Renal/GU negative Renal ROS     Musculoskeletal negative musculoskeletal ROS (+)   Abdominal   Peds  Hematology negative hematology ROS (+)   Anesthesia Other Findings Day of surgery medications reviewed with the patient.  Reproductive/Obstetrics                             Anesthesia Physical Anesthesia Plan  ASA: II  Anesthesia Plan: General   Post-op Pain Management:    Induction: Intravenous, Rapid sequence and Cricoid pressure planned  PONV Risk Score and Plan: 4 or greater and Ondansetron, Dexamethasone, Midazolam and Scopolamine patch - Pre-op  Airway Management Planned: Oral ETT  Additional Equipment:   Intra-op Plan:   Post-operative Plan: Extubation in OR  Informed Consent:   Plan Discussed with: Anesthesiologist  Anesthesia Plan Comments:        Anesthesia Quick Evaluation

## 2019-03-02 NOTE — ED Provider Notes (Signed)
[facility] EMERGENCY DEPARTMENT Provider Note   CSN: [id number] Arrival date & time: 03/02/19  0630     History Chief Complaint  Patient presents with  . Abdominal Pain    Valerie Crane is a 24 y.o. female.  HPI She complains of "sharp" abdominal pain, right-sided upper and lower present for 2 days associated with diarrhea 2 days ago and vomiting yesterday.  She denies fever, chills, cough, shortness of breath.  She received her first Covid vaccine, 4 days ago.  No prior similar problems.  No prior abdominal surgery.  She works as a Agricultural engineer.  There are no other known modifying factors.    Past Medical History:  Diagnosis Date  . Medical history non-contributory     Patient Active Problem List   Diagnosis Date Noted  . [redacted] weeks gestation of pregnancy 11/13/2018  . Severe preeclampsia 11/12/2018  . LGA (large for gestational age) fetus affecting management of mother, third trimester 10/15/2018  . Maternal morbid obesity, antepartum (HCC) 10/09/2018  . Diet controlled gestational diabetes mellitus in third trimester 09/24/2018  . Trichomonal vaginitis during pregnancy in first trimester 06/26/2018  . Excessive weight gain during pregnancy in third trimester 05/12/2018  . Supervision of high-risk pregnancy 05/08/2018    Past Surgical History:  Procedure Laterality Date  . CESAREAN SECTION N/A 11/13/2018   Procedure: CESAREAN SECTION;  Surgeon: Reva Bores, MD;  Location: [facility] LD ORS;  Service: Obstetrics;  Laterality: N/A;  . NO PAST SURGERIES       OB History    Gravida  1   Para  1   Term  1   Preterm      AB      Living  1     SAB      TAB      Ectopic      Multiple  0   Live Births  1           Family History  Problem Relation Age of Onset  . Diabetes Mother     Social History   Tobacco Use  . Smoking status: Former Smoker    Packs/day: 0.50    Types: Cigarettes    Quit date: 03/08/2018    Years since  quitting: 0.9  . Smokeless tobacco: Never Used  Substance Use Topics  . Alcohol use: No  . Drug use: Not Currently    Types: Marijuana    Comment: none since March    Home Medications Prior to Admission medications   Medication Sig Start Date End Date Taking? Authorizing Provider  acetaminophen (TYLENOL) 325 MG tablet Take 2 tablets (650 mg total) by mouth every 6 (six) hours as needed for mild pain (temperature > 101.5.). 11/16/18  Yes Dove, Myra C, MD  ibuprofen (ADVIL) 600 MG tablet Take 1 tablet (600 mg total) by mouth every 6 (six) hours as needed for fever or headache. 11/16/18  Yes Dove, Myra C, MD  amLODipine (NORVASC) 10 MG tablet TAKE 1 TABLET BY MOUTH EVERY DAY 02/09/19   Nicholaus Bloom C, MD  oxyCODONE (OXY IR/ROXICODONE) 5 MG immediate release tablet Take 1-2 tablets (5-10 mg total) by mouth every 4 (four) hours as needed for moderate pain. Patient not taking: Reported on 03/02/2019 11/16/18   Allie Bossier, MD    Allergies    Patient has no known allergies.  Review of Systems   Review of Systems  All other systems reviewed and are negative.  Physical Exam Updated Vital Signs BP 113/68   Pulse 61   Temp 99.8 F (37.7 C) (Oral)   Resp 16   LMP 01/25/2019   SpO2 98%   Physical Exam Vitals and nursing note reviewed.  Constitutional:      Appearance: She is well-developed.  HENT:     Head: Normocephalic and atraumatic.     Right Ear: External ear normal.     Left Ear: External ear normal.     Mouth/Throat:     Pharynx: No pharyngeal swelling or oropharyngeal exudate.  Eyes:     Conjunctiva/sclera: Conjunctivae normal.     Pupils: Pupils are equal, round, and reactive to light.  Neck:     Trachea: Phonation normal.  Cardiovascular:     Rate and Rhythm: Normal rate and regular rhythm.     Heart sounds: Normal heart sounds.  Pulmonary:     Effort: Pulmonary effort is normal.     Breath sounds: Normal breath sounds.  Abdominal:     General: There is no  distension.     Palpations: Abdomen is soft. There is no mass.     Tenderness: There is abdominal tenderness (Right upper lower quadrants, mild). There is no guarding.     Hernia: No hernia is present.  Musculoskeletal:        General: Normal range of motion.     Cervical back: Normal range of motion and neck supple.  Skin:    General: Skin is warm and dry.  Neurological:     Mental Status: She is alert and oriented to person, place, and time.     Cranial Nerves: No cranial nerve deficit.     Sensory: No sensory deficit.     Motor: No abnormal muscle tone.     Coordination: Coordination normal.  Psychiatric:        Mood and Affect: Mood normal.        Behavior: Behavior normal.        Thought Content: Thought content normal.        Judgment: Judgment normal.     ED Results / Procedures / Treatments   Labs (all labs ordered are listed, but only abnormal results are displayed) Labs Reviewed  COMPREHENSIVE METABOLIC PANEL - Abnormal; Notable for the following components:      Result Value   Potassium 3.4 (*)    CO2 20 (*)    Glucose, Bld 132 (*)    Anion gap 16 (*)    All other components within normal limits  CBC - Abnormal; Notable for the following components:   WBC 13.6 (*)    RDW 16.1 (*)    All other components within normal limits  URINALYSIS, ROUTINE W REFLEX MICROSCOPIC - Abnormal; Notable for the following components:   APPearance HAZY (*)    Ketones, ur 5 (*)    Protein, ur 30 (*)    All other components within normal limits  RESPIRATORY PANEL BY RT PCR (FLU A&B, COVID)  LIPASE, BLOOD  HCG, QUANTITATIVE, PREGNANCY  I-STAT BETA HCG BLOOD, ED ([facility], [facility], [facility] ONLY)    EKG None  Radiology CT Abdomen Pelvis W Contrast  Result Date: 03/02/2019 CLINICAL DATA:  Abdominal pain EXAM: CT ABDOMEN AND PELVIS WITH CONTRAST TECHNIQUE: Multidetector CT imaging of the abdomen and pelvis was performed using the standard protocol following bolus administration of intravenous  contrast. CONTRAST:  OMNIPAQUE IOHEXOL 300 MG/ML  SOLN COMPARISON:  None. FINDINGS: Lower chest: There is bibasilar atelectasis. Hepatobiliary:  No focal liver lesions are appreciable. Gallbladder wall is not appreciably thickened. There is no biliary duct dilatation. Pancreas: There is no pancreatic mass or inflammatory focus. Spleen: Spleen measures 14.8 x 13.2 x 7.7 cm with a measured splenic volume of 752 cubic cm. No focal splenic lesions are evident. Adrenals/Urinary Tract: Adrenals bilaterally appear normal. Kidneys bilaterally show no evident mass or hydronephrosis on either side. There is no evident renal or ureteral calculus on either side. Urinary bladder is midline with wall thickness within normal limits. Stomach/Bowel: There is no appreciable bowel wall or mesenteric thickening. There are scattered colonic diverticula without diverticulitis. The terminal ileum appears normal. No evident bowel obstruction. There is no free air or portal venous air. Vascular/Lymphatic: There is no abdominal aortic aneurysm. No vascular lesions are evident. Major venous structures are patent. There is no adenopathy in the abdomen or pelvis. Reproductive: Uterus is anteverted. There is no pelvic mass evident. There is free fluid in the cul-de-sac. Other: The appendix is distended, measuring 1.4 cm in diameter. There is soft tissue stranding along the course of the appendix. The wall of the appendix is mildly enhancing diffusely. There is no periappendiceal region abscess or perforation. No abscess evident in the abdomen or pelvis. Musculoskeletal: No blastic or lytic bone lesions are evident. There is no intramuscular or abdominal wall lesion. IMPRESSION: 1.  Evidence of acute appendicitis. Appendix: Location: Appendix arises from the cecum inferomedially, tracking transversely in the right mid pelvis with the tip slightly to the right of midline in mid pelvic region. Diameter: 1.4 cm Appendicolith: None Mucosal  hyper-enhancement: Present Extraluminal gas: None Periappendiceal collection: None. There is soft tissue stranding along the course of the appendix. 2. Free fluid in the cul-de-sac. This fluid may be of reactive etiology given the appendicitis or could represent recent ovarian cyst rupture. 3. Scattered colonic diverticula without diverticulitis. No bowel obstruction. No abscess in the abdomen or pelvis. 4.  Splenomegaly of uncertain etiology.  No splenic lesions evident. 5. No renal or ureteral calculus. No hydronephrosis. Urinary bladder wall thickness normal. Critical Value/emergent results were called by telephone at the time of interpretation on 03/02/2019 at 2:11 pm to [facility], [place], who verbally acknowledged these results. Electronically Signed   By: Lowella Grip III M.D.   On: 03/02/2019 14:12    Procedures Procedures (including critical care time)  Medications Ordered in ED Medications  sodium chloride flush (NS) 0.9 % injection 3 mL (3 mLs Intravenous Not Given 03/02/19 1510)  cefTRIAXone (ROCEPHIN) 2 g in sodium chloride 0.9 % 100 mL IVPB (2 g Intravenous New Bag/Given 03/02/19 1523)  metroNIDAZOLE (FLAGYL) IVPB 500 mg (has no administration in time range)  lactated ringers 1,000 mL with potassium chloride 20 mEq infusion (has no administration in time range)  fentaNYL (SUBLIMAZE) injection 100 mcg (100 mcg Intravenous Given 03/02/19 1507)  sodium chloride 0.9 % bolus 1,000 mL (0 mLs Intravenous Stopped 03/02/19 1252)  ondansetron (ZOFRAN) injection 4 mg (4 mg Intravenous Given 03/02/19 1003)  iohexol (OMNIPAQUE) 300 MG/ML solution 100 mL (100 mLs Intravenous Contrast Given 03/02/19 1402)    ED Course  I have reviewed the triage vital signs and the nursing notes.  Pertinent labs & imaging results that were available during my care of the patient were reviewed by me and considered in my medical decision making (see chart for details).  Clinical Course as of Mar 01 1540  Mon  Mar 02, 2019  1430 Normal  I-Stat beta hCG blood, ED [EW]  1430 Normal except potassium low, CO2 low, glucose high  Comprehensive metabolic panel(!) [EW]  1430 Normal except presence of ketones and protein  Urinalysis, Routine w reflex microscopic(!) [EW]  1430 Normal   [EW]  1430 Normal except white count high  CBC(!) [EW]  1431 Per radiologist, uncomplicated appendicitis  CT Abdomen Pelvis W Contrast [EW]  1500 Case discussed with general surgery who see the patient in the emergency department for evaluation and treatment.   [EW]    Clinical Course User Index [EW] Mancel Bale, MD   MDM Rules/Calculators/A&P                       Patient Vitals for the past 24 hrs:  BP Temp Temp src Pulse Resp SpO2  03/02/19 1515 113/68 -- -- 61 -- 98 %  03/02/19 1345 -- -- -- 62 -- 98 %  03/02/19 1100 109/77 -- -- 70 -- 98 %  03/02/19 1030 111/72 -- -- 68 -- 98 %  03/02/19 1015 115/81 -- -- 62 -- 97 %  03/02/19 0930 121/73 -- -- 69 -- 97 %  03/02/19 0915 118/84 -- -- 65 -- 94 %  03/02/19 0900 (!) 120/91 -- -- 61 -- 99 %  03/02/19 0845 104/74 -- -- 62 -- 98 %  03/02/19 0830 106/70 -- -- 62 -- 98 %  03/02/19 0745 120/73 -- -- 69 -- 96 %  03/02/19 0733 134/76 99.8 F (37.7 C) Oral 95 16 100 %    2:31 PM Reevaluation with update and discussion. After initial assessment and treatment, an updated evaluation reveals no change in clinical status, fine discussed with the patient who will call her mother to discuss things.  All questions answered. Mancel Bale   Medical Decision Making: Patient presenting with nonspecific pain, right upper and lower abdomen, for 2 days.  She describes pain as sharp.  She is nontoxic.  Vital signs normal.  CT shows uncomplicated appendicitis, requiring surgical consultation.  Covid testing sent.  CRITICAL CARE-no Performed by: Mancel Bale   Nursing Notes Reviewed/ Care Coordinated Applicable Imaging Reviewed Interpretation of Laboratory Data incorporated  into ED treatment  Plan-disposition as per general surgery services.   Final Clinical Impression(s) / ED Diagnoses Final diagnoses:  Acute appendicitis, unspecified acute appendicitis type    Rx / DC Orders ED Discharge Orders    None       Mancel Bale, MD 03/02/19 1542

## 2019-03-02 NOTE — ED Triage Notes (Signed)
C/o sharp upper abd pain since yesterday.  Received COVID vaccine on Friday.

## 2019-03-02 NOTE — Progress Notes (Signed)
Patient ID: Valerie Crane, female   DOB: 15-Aug-1995, 24 y.o.   MRN: [id number] Patient COVID +. I D/W Dr. Dwain Sarna. Will try non-op management. Admit for IV ABX. I discussed with her in detail.  Violeta Gelinas, MD, MPH, FACS Please use [email or web address] to contact on call provider

## 2019-03-03 LAB — CBC
HCT: 36.8 % (ref 36.0–46.0)
Hemoglobin: 11.5 g/dL — ABNORMAL LOW (ref 12.0–15.0)
MCH: 26.6 pg (ref 26.0–34.0)
MCHC: 31.3 g/dL (ref 30.0–36.0)
MCV: 85.2 fL (ref 80.0–100.0)
Platelets: 220 10*3/uL (ref 150–400)
RBC: 4.32 MIL/uL (ref 3.87–5.11)
RDW: 16 % — ABNORMAL HIGH (ref 11.5–15.5)
WBC: 6.5 10*3/uL (ref 4.0–10.5)
nRBC: 0 % (ref 0.0–0.2)

## 2019-03-03 MED ORDER — DOCUSATE SODIUM 100 MG PO CAPS
100.0000 mg | ORAL_CAPSULE | Freq: Two times a day (BID) | ORAL | Status: DC
  Administered 2019-03-03 – 2019-03-04 (×3): 100 mg via ORAL
  Filled 2019-03-03 (×3): qty 1

## 2019-03-03 NOTE — Progress Notes (Signed)
1 Day Post-Op  Subjective: CC: Abdominal pain Patient reports that her pain worsened overnight around 3am when she was walking to the restroom to urinate.  She reports this was severe, sharp and located in her right lower quadrant.  Her pain is now similar to what it was upon initial presentation.  She does have waves of nausea. No emesis.  She is passing flatus.  She has not had a BM in the last 2 days and usually goes daily.  Reports c-section 3 months ago. No other medical hx.  Objective: Vital signs in last 24 hours: Temp:  [98.6 F (37 C)] 98.6 F (37 C) (01/25 1907) Pulse Rate:  [61-74] 64 (01/25 1907) Resp:  [16] 16 (01/25 1907) BP: (104-123)/(68-97) 110/97 (01/25 1907) SpO2:  [94 %-99 %] 96 % (01/25 1907)    Intake/Output from previous day: 01/25 0701 - 01/26 0700 In: 1300 [IV Piggyback:1300] Out: 400 [Urine:400] Intake/Output this shift: No intake/output data recorded.  PE: Gen:  Alert, NAD, pleasant Card:  RRR Pulm:  CTAB, no W/R/R, effort normal Abd: Soft, ND, tenderness in the RLQ with voluntary guarding.  No involuntary guarding, rebound or rigidity. +BS Ext:  No LE edema Psych: A&Ox3  Skin: no rashes noted, warm and dry  Lab Results:  Recent Labs    03/02/19 0740 03/03/19 0514  WBC 13.6* 6.5  HGB 13.3 11.5*  HCT 42.1 36.8  PLT 323 220   BMET Recent Labs    03/02/19 0740  NA 139  K 3.4*  CL 103  CO2 20*  GLUCOSE 132*  BUN 11  CREATININE 0.87  CALCIUM 9.1   PT/INR No results for input(s): LABPROT, INR in the last 72 hours. CMP     Component Value Date/Time   NA 139 03/02/2019 0740   NA 137 11/06/2018 1448   K 3.4 (L) 03/02/2019 0740   CL 103 03/02/2019 0740   CO2 20 (L) 03/02/2019 0740   GLUCOSE 132 (H) 03/02/2019 0740   BUN 11 03/02/2019 0740   BUN 10 11/06/2018 1448   CREATININE 0.87 03/02/2019 0740   CALCIUM 9.1 03/02/2019 0740   PROT 7.7 03/02/2019 0740   PROT 5.8 (L) 11/06/2018 1448   ALBUMIN 4.2 03/02/2019 0740   ALBUMIN 3.5 (L) 11/06/2018 1448   AST 26 03/02/2019 0740   ALT 34 03/02/2019 0740   ALKPHOS 71 03/02/2019 0740   BILITOT 0.6 03/02/2019 0740   BILITOT <0.2 11/06/2018 1448   GFRNONAA >60 03/02/2019 0740   GFRAA >60 03/02/2019 0740   Lipase     Component Value Date/Time   LIPASE 20 03/02/2019 0740       Studies/Results: CT Abdomen Pelvis W Contrast  Result Date: 03/02/2019 CLINICAL DATA:  Abdominal pain EXAM: CT ABDOMEN AND PELVIS WITH CONTRAST TECHNIQUE: Multidetector CT imaging of the abdomen and pelvis was performed using the standard protocol following bolus administration of intravenous contrast. CONTRAST:  OMNIPAQUE IOHEXOL 300 MG/ML  SOLN COMPARISON:  None. FINDINGS: Lower chest: There is bibasilar atelectasis. Hepatobiliary: No focal liver lesions are appreciable. Gallbladder wall is not appreciably thickened. There is no biliary duct dilatation. Pancreas: There is no pancreatic mass or inflammatory focus. Spleen: Spleen measures 14.8 x 13.2 x 7.7 cm with a measured splenic volume of 752 cubic cm. No focal splenic lesions are evident. Adrenals/Urinary Tract: Adrenals bilaterally appear normal. Kidneys bilaterally show no evident mass or hydronephrosis on either side. There is no evident renal or ureteral calculus on either side. Urinary bladder  is midline with wall thickness within normal limits. Stomach/Bowel: There is no appreciable bowel wall or mesenteric thickening. There are scattered colonic diverticula without diverticulitis. The terminal ileum appears normal. No evident bowel obstruction. There is no free air or portal venous air. Vascular/Lymphatic: There is no abdominal aortic aneurysm. No vascular lesions are evident. Major venous structures are patent. There is no adenopathy in the abdomen or pelvis. Reproductive: Uterus is anteverted. There is no pelvic mass evident. There is free fluid in the cul-de-sac. Other: The appendix is distended, measuring 1.4 cm in diameter.  There is soft tissue stranding along the course of the appendix. The wall of the appendix is mildly enhancing diffusely. There is no periappendiceal region abscess or perforation. No abscess evident in the abdomen or pelvis. Musculoskeletal: No blastic or lytic bone lesions are evident. There is no intramuscular or abdominal wall lesion. IMPRESSION: 1.  Evidence of acute appendicitis. Appendix: Location: Appendix arises from the cecum inferomedially, tracking transversely in the right mid pelvis with the tip slightly to the right of midline in mid pelvic region. Diameter: 1.4 cm Appendicolith: None Mucosal hyper-enhancement: Present Extraluminal gas: None Periappendiceal collection: None. There is soft tissue stranding along the course of the appendix. 2. Free fluid in the cul-de-sac. This fluid may be of reactive etiology given the appendicitis or could represent recent ovarian cyst rupture. 3. Scattered colonic diverticula without diverticulitis. No bowel obstruction. No abscess in the abdomen or pelvis. 4.  Splenomegaly of uncertain etiology.  No splenic lesions evident. 5. No renal or ureteral calculus. No hydronephrosis. Urinary bladder wall thickness normal. Critical Value/emergent results were called by telephone at the time of interpretation on 03/02/2019 at 2:11 pm to [facility], [place], who verbally acknowledged these results. Electronically Signed   By: Bretta Bang III M.D.   On: 03/02/2019 14:12    Anti-infectives: Anti-infectives (From admission, onward)   Start     Dose/Rate Route Frequency Ordered Stop   03/03/19 1515  cefTRIAXone (ROCEPHIN) 2 g in sodium chloride 0.9 % 100 mL IVPB     2 g 200 mL/hr over 30 Minutes Intravenous Every 24 hours 03/02/19 1839     03/02/19 2315  metroNIDAZOLE (FLAGYL) IVPB 500 mg     500 mg 100 mL/hr over 60 Minutes Intravenous Every 8 hours 03/02/19 1839     03/02/19 1515  cefTRIAXone (ROCEPHIN) 2 g in sodium chloride 0.9 % 100 mL IVPB     2 g 200  mL/hr over 30 Minutes Intravenous Every 24 hours 03/02/19 1509     03/02/19 1515  metroNIDAZOLE (FLAGYL) IVPB 500 mg     500 mg 100 mL/hr over 60 Minutes Intravenous Every 8 hours 03/02/19 1509         Assessment/Plan Hx preeclampsia - c-section 10/20 Diet controlled gestational diabetes Obesity BMI 40.5 Hypertension COVID+  Acute appendicitis - Given Covid diagnosis and increased risk of M/M with general anesthesia as well as increased risk for VTE etc, we will attempt to treat with IV antibiotics alone.  -Continue IV antibiotics -WBC has normalized.  She is afebrile. -Remain on bowel rest until pain has improved -Mobilize, IS -Will follow pain, vitals, and labs closely. If patient does not improve then she may require surgery or repeat CT later this week.  If she does improve, we will treat with 14 days of antibiotics and follow-up in the office to discuss interval laparoscopic appendectomy on an elective basis.   FEN - NPO, IVF VTE - SCDs, Lovenox  ID - Rocephin/Flagyl 1/25 >> Foley - None Follow-Up - TBD    LOS: 1 day    Jillyn Ledger , [facility] Surgery 03/03/2019, 8:27 AM Please see Amion for pager number during day hours 7:00am-4:30pm

## 2019-03-04 MED ORDER — AMOXICILLIN-POT CLAVULANATE 875-125 MG PO TABS
1.0000 | ORAL_TABLET | Freq: Two times a day (BID) | ORAL | Status: DC
  Administered 2019-03-04: 1 via ORAL
  Filled 2019-03-04: qty 1

## 2019-03-04 MED ORDER — ONDANSETRON 4 MG PO TBDP: 4.0000 mg | ORAL_TABLET | Freq: Four times a day (QID) | ORAL | 0 refills | Status: DC | PRN

## 2019-03-04 MED ORDER — PANTOPRAZOLE SODIUM 40 MG PO TBEC: 40.0000 mg | DELAYED_RELEASE_TABLET | Freq: Every day | ORAL | Status: DC

## 2019-03-04 MED ORDER — OXYCODONE HCL 5 MG PO TABS: 5.0000 mg | ORAL_TABLET | Freq: Four times a day (QID) | ORAL | 0 refills | Status: DC | PRN

## 2019-03-04 MED ORDER — DOCUSATE SODIUM 100 MG PO CAPS: 100.0000 mg | ORAL_CAPSULE | Freq: Two times a day (BID) | ORAL | 0 refills | Status: DC | PRN

## 2019-03-04 MED ORDER — AMOXICILLIN-POT CLAVULANATE 875-125 MG PO TABS: 1.0000 | ORAL_TABLET | Freq: Two times a day (BID) | ORAL | 0 refills | Status: AC

## 2019-03-04 NOTE — Progress Notes (Addendum)
P[atient doesn't have an IV access and she refused one. MD was notified. Also, she requested a new COVID test before she is going home.

## 2019-03-04 NOTE — Progress Notes (Addendum)
Patient was discharged home by MD order; discharged instructions  review and give to patient with care notes; IV DIC; skin intact; patient will be escorted to the car by nurse tech via wheelchair.  

## 2019-03-04 NOTE — Discharge Summary (Signed)
Patient ID: VENISSA NAPPI [id number] 01-17-1996 24 y.o.  Admit date: 03/02/2019 Discharge date: 03/04/2019  Admitting Diagnosis: Acute appendicitis  Hx preeclampsia Diet controlled gestational diabetes Obesity BMI 40.5 Hypertension  Discharge Diagnosis Acute appendicitis  COVID + Hx preeclampsia Diet controlled gestational diabetes Obesity BMI 40.5 Hypertension  Consultants None   H&P: Patient is a 24 year old female who developed abdominal pain yesterday.  She reports receiving the Covid vaccine on 02/28/2019.  She says she has had sharp abdominal pain since yesterday that started generalized but then localized to the RLQ associated with diarrhea, and vomiting. She vomited again on the way here. She denied fevers chills, cough, shortness of breath.  She works as a Agricultural engineer.  Work-up in the ED: She is afebrile, vital signs are stable.  Temp is 99.8.  Labs show a potassium of 3.4, CO2 of 20, glucose of 132, anion gap of 16.  Creatinine 0.87.  LFTs are normal lipase is 20.  WBC 13.6, hemoglobin 13.3, hematocrit 42, platelets are 323,000.  Urinalysis unremarkable.  HCG<5.0.  CT of the abdomen with contrast there is the appendix is measuring 1.4 cm in diameter.  There is soft tissue stranding along the course of the appendix.  The wall appendix mildly enhancing diffusely.  There is no.  Appendiceal region of abscess or perforation.  This is consistent with acute appendicitis.  There was some free fluid in the cul-de-sac.Marland Kitchen  Scattered colonic diverticuli without diverticulitis.  We have been called to see.  Procedures None   Hospital Course:  Patient was admitted to the general surgery service.  Screening Covid test came back positive.  She is asymptomatic from this.  Given increased risks with anesthesia, VTE etc. W/ COVID, patient was given trial of nonop management.  She was placed on IV antibiotics and bowel rest.  On hospital day 1, pain improved and WBC normalized.   On hospital day 2, pain continued to improve.  Patient's diet was advanced and tolerated.  Given patient's improvement of vitals, lab work, exam, and ability to tolerate a diet, she was felt to be safe for discharge home on a course of oral antibiotics.  Patient was given instructions on quarantining given her Covid diagnosis.  Patient was given follow-up in our office.  Discharge instructions and return precautions were discussed.  Allergies as of 03/04/2019   No Known Allergies     Medication List    STOP taking these medications   ibuprofen 600 MG tablet Commonly known as: ADVIL     TAKE these medications   acetaminophen 325 MG tablet Commonly known as: TYLENOL Take 2 tablets (650 mg total) by mouth every 6 (six) hours as needed for mild pain (temperature > 101.5.).   amLODipine 10 MG tablet Commonly known as: NORVASC TAKE 1 TABLET BY MOUTH EVERY DAY   amoxicillin-clavulanate 875-125 MG tablet Commonly known as: AUGMENTIN Take 1 tablet by mouth every 12 (twelve) hours for 12 days.   docusate sodium 100 MG capsule Commonly known as: COLACE Take 1 capsule (100 mg total) by mouth 2 (two) times daily as needed for mild constipation.   ondansetron 4 MG disintegrating tablet Commonly known as: ZOFRAN-ODT Take 1 tablet (4 mg total) by mouth every 6 (six) hours as needed for nausea.   oxyCODONE 5 MG immediate release tablet Commonly known as: Roxicodone Take 1 tablet (5 mg total) by mouth every 6 (six) hours as needed for breakthrough pain. What changed:   how much to take  when to take this  reasons to take this        Follow-up Information    Georganna Skeans, MD. Go on 03/25/2019.   Specialty: General Surgery Why: Your appointment is 03/25/2019 at 11:45am Please arrive 30 minutes prior to your appointment to check in and fill out paperwork. Bring photo ID and insurance information. Contact information: [address] [place] [address] [phone]            Signed: Alferd Apa, [facility] Surgery 03/04/2019, 2:49 PM Please see Amion for pager number during day hours 7:00am-4:30pm

## 2019-03-04 NOTE — Progress Notes (Signed)
Attempted IV placement 22G L wrist. Upon insertion of the needle, patient started crying. When further questioned, patient stated that she felt pins and needles and was in excruciating pain. Needle was removed immediately. Left hand was assessed. She denied numbness/tingling or any radiation of pain. Pain was localized to insertion site. Patient has full ROM and was asked to close eyes to assess for feeling in her hand. She has full sensation throughout her hand. After 30 minutes, she stated: "the pain now feels like it has with previous IV attempts."

## 2019-03-04 NOTE — Discharge Instructions (Addendum)
Take antibiotics as prescribed Call or return to ED with concerns (ie worsening fever, abdominal pain, nausea/vomiting) Follow up in about 3 weeks with Dr. Janee Crane to discuss possibly having your appendix removed in the future   Appendicitis, Adult  The appendix is a tube in the body that is shaped like a finger. It is attached to the large intestine. Appendicitis means that this tube is swollen (inflamed). If this is not treated, the tube can tear (rupture). This can lead to a life-threatening infection. This condition can also cause pus to build up in the appendix (abscess). What are the causes? This condition may be caused by something that blocks the appendix. These include:  A ball of poop (stool).  Lymph glands that are bigger than normal. Sometimes the cause is not known. What increases the risk? You are more likely to develop this condition if you are between 3810 and 25 years of age. What are the signs or symptoms? Symptoms of this condition include:  Pain around the belly button (navel). ? The pain moves toward the lower right belly (abdomen). ? The pain can get worse with time. ? The pain can get worse if you cough. ? The pain can get worse if you move suddenly.  Tenderness in the lower right belly.  Feeling sick to your stomach (nauseous).  Throwing up (vomiting).  Not feeling hungry (loss of appetite).  A fever.  Having trouble pooping (constipation).  Watery poop (diarrhea).  Not feeling well. How is this treated? Most often, this condition is treated by taking out the appendix (appendectomy). There are two ways to do this:  Open surgery. For this method, the appendix is taken out through a large cut (incision). The cut is made in the lower right belly. This surgery may be used if: ? You have scars from another surgery. ? You have a bleeding condition. ? You are pregnant and will be having your baby soon. ? You have a condition that makes it hard to do the  other type of surgery.  Laparoscopic surgery. For this method, the appendix is taken out through small cuts. Often, this surgery: ? Causes less pain. ? Causes fewer problems. ? Is easier to heal from. If your appendix tears and pus forms:  A drain may be put into the sore. The drain will be used to get rid of the pus.  You may get an antibiotic medicine through an IV line.  Your appendix may or may not need to be taken out. Follow these instructions at home: If you had surgery, follow instructions from your doctor on how to care for yourself at home and how to take care of your cut from surgery. Medicines  Take over-the-counter and prescription medicines only as told by your doctor.  If you were prescribed an antibiotic medicine, take it as told by your doctor. Do not stop taking the antibiotic even if you start to feel better. Eating and drinking Follow instructions from your doctor about what you cannot eat or drink. You may go back to your diet slowly if:  You no longer feel sick to your stomach.  You have stopped throwing up. General instructions  Do not use any products that contain nicotine or tobacco, such as cigarettes, e-cigarettes, and chewing tobacco. If you need help quitting, ask your doctor.  Do not drive or use heavy machinery while taking prescription pain medicine.  Ask your doctor if the medicine you are taking can cause trouble pooping. You may  need to take steps to prevent or treat trouble pooping: ? Drink enough fluid to keep your pee (urine) pale yellow. ? Take over-the-counter or prescription medicines. ? Eat foods that are high in fiber. These include beans, whole grains, and fresh fruits and vegetables. ? Limit foods that are high in fat and sugar. These include fried or sweet foods.  Keep all follow-up visits as told by your doctor. This is important. Contact a doctor if:  There is pus, blood, or a lot of fluid coming from your cut or cuts from  surgery.  You are sick to your stomach or you throw up. Get help right away if:  You have pain in your belly, and the pain is getting worse.  You have a fever.  You have chills.  You are very tired.  You have muscle pain.  You are short of breath. Summary  Appendicitis is swelling of the appendix. The appendix is a tube that is shaped like a finger. It is joined to the large intestine.  This condition may be caused by something that blocks the appendix. This can lead to an infection.  This condition is usually treated by taking out the appendix. This information is not intended to replace advice given to you by your health care provider. Make sure you discuss any questions you have with your health care provider. Document Revised: 07/10/2017 Document Reviewed: 07/10/2017 Elsevier Patient Education  2020 [employer].   Regarding COVID: -You need to quarantine at home until 03/12/2019, and can discontinue quarantine at that time as long as you are fever free for 24 hours and covid symptoms improving -Avoid "NSAIDS" (ie Ibuprofen, Advil, Naproxen, Motrin ...) -Return to the hospital if signs of shortness of breath get worse -You can buy a pulse oximeter at the pharmacy, if the oxygen saturations drop below 90%, you need to return to the hospital. -Recommend a phone visit with your primary care physician approximately 2 weeks after discharge -You can find more information on the website for the CDC or the [employer] [facility] Department of Health and Human Service   COVID-19 COVID-19 is a respiratory infection that is caused by a virus called severe acute respiratory syndrome coronavirus 2 (SARS-CoV-2). The disease is also known as coronavirus disease or novel coronavirus. In some people, the virus may not cause any symptoms. In others, it may cause a serious infection. The infection can get worse quickly and can lead to complications, such as:  Pneumonia, or infection of the lungs.  Acute  respiratory distress syndrome or ARDS. This is a condition in which fluid build-up in the lungs prevents the lungs from filling with air and passing oxygen into the blood.  Acute respiratory failure. This is a condition in which there is not enough oxygen passing from the lungs to the body or when carbon dioxide is not passing from the lungs out of the body.  Sepsis or septic shock. This is a serious bodily reaction to an infection.  Blood clotting problems.  Secondary infections due to bacteria or fungus.  Organ failure. This is when your body's organs stop working. The virus that causes COVID-19 is contagious. This means that it can spread from person to person through droplets from coughs and sneezes (respiratory secretions). What are the causes? This illness is caused by a virus. You may catch the virus by:  Breathing in droplets from an infected person. Droplets can be spread by a person breathing, speaking, singing, coughing, or sneezing.  Touching something,  like a table or a doorknob, that was exposed to the virus (contaminated) and then touching your mouth, nose, or eyes. What increases the risk? Risk for infection You are more likely to be infected with this virus if you:  Are within 6 feet (2 meters) of a person with COVID-19.  Provide care for or live with a person who is infected with COVID-19.  Spend time in crowded indoor spaces or live in shared housing. Risk for serious illness You are more likely to become seriously ill from the virus if you:  Are 31 years of age or older. The higher your age, the more you are at risk for serious illness.  Live in a nursing home or long-term care facility.  Have cancer.  Have a long-term (chronic) disease such as: ? Chronic lung disease, including chronic obstructive pulmonary disease or asthma. ? A long-term disease that lowers your body's ability to fight infection (immunocompromised). ? Heart disease, including heart  failure, a condition in which the arteries that lead to the heart become narrow or blocked (coronary artery disease), a disease which makes the heart muscle thick, weak, or stiff (cardiomyopathy). ? Diabetes. ? Chronic kidney disease. ? Sickle cell disease, a condition in which red blood cells have an abnormal "sickle" shape. ? Liver disease.  Are obese. What are the signs or symptoms? Symptoms of this condition can range from mild to severe. Symptoms may appear any time from 2 to 14 days after being exposed to the virus. They include:  A fever or chills.  A cough.  Difficulty breathing.  Headaches, body aches, or muscle aches.  Runny or stuffy (congested) nose.  A sore throat.  New loss of taste or smell. Some people may also have stomach problems, such as nausea, vomiting, or diarrhea. Other people may not have any symptoms of COVID-19. How is this diagnosed? This condition may be diagnosed based on:  Your signs and symptoms, especially if: ? You live in an area with a COVID-19 outbreak. ? You recently traveled to or from an area where the virus is common. ? You provide care for or live with a person who was diagnosed with COVID-19. ? You were exposed to a person who was diagnosed with COVID-19.  A physical exam.  Lab tests, which may include: ? Taking a sample of fluid from the back of your nose and throat (nasopharyngeal fluid), your nose, or your throat using a swab. ? A sample of mucus from your lungs (sputum). ? Blood tests.  Imaging tests, which may include, X-rays, CT scan, or ultrasound. How is this treated? At present, there is no medicine to treat COVID-19. Medicines that treat other diseases are being used on a trial basis to see if they are effective against COVID-19. Your health care provider will talk with you about ways to treat your symptoms. For most people, the infection is mild and can be managed at home with rest, fluids, and over-the-counter  medicines. Treatment for a serious infection usually takes places in a hospital intensive care unit (ICU). It may include one or more of the following treatments. These treatments are given until your symptoms improve.  Receiving fluids and medicines through an IV.  Supplemental oxygen. Extra oxygen is given through a tube in the nose, a face mask, or a hood.  Positioning you to lie on your stomach (prone position). This makes it easier for oxygen to get into the lungs.  Continuous positive airway pressure (CPAP) or bi-level positive  airway pressure (BPAP) machine. This treatment uses mild air pressure to keep the airways open. A tube that is connected to a motor delivers oxygen to the body.  Ventilator. This treatment moves air into and out of the lungs by using a tube that is placed in your windpipe.  Tracheostomy. This is a procedure to create a hole in the neck so that a breathing tube can be inserted.  Extracorporeal membrane oxygenation (ECMO). This procedure gives the lungs a chance to recover by taking over the functions of the heart and lungs. It supplies oxygen to the body and removes carbon dioxide. Follow these instructions at home: Lifestyle  If you are sick, stay home except to get medical care. Your health care provider will tell you how long to stay home. Call your health care provider before you go for medical care.  Rest at home as told by your health care provider.  Do not use any products that contain nicotine or tobacco, such as cigarettes, e-cigarettes, and chewing tobacco. If you need help quitting, ask your health care provider.  Return to your normal activities as told by your health care provider. Ask your health care provider what activities are safe for you. General instructions  Take over-the-counter and prescription medicines only as told by your health care provider.  Drink enough fluid to keep your urine pale yellow.  Keep all follow-up visits as told  by your health care provider. This is important. How is this prevented?  There is no vaccine to help prevent COVID-19 infection. However, there are steps you can take to protect yourself and others from this virus. To protect yourself:   Do not travel to areas where COVID-19 is a risk. The areas where COVID-19 is reported change often. To identify high-risk areas and travel restrictions, check the CDC travel website: [employer].siwwwnc.cdc.gov/travel/notices  If you live in, or must travel to, an area where COVID-19 is a risk, take precautions to avoid infection. ? Stay away from people who are sick. ? Wash your hands often with soap and water for 20 seconds. If soap and water are not available, use an alcohol-based hand sanitizer. ? Avoid touching your mouth, face, eyes, or nose. ? Avoid going out in public, follow guidance from your state and local health authorities. ? If you must go out in public, wear a cloth face covering or face mask. Make sure your mask covers your nose and mouth. ? Avoid crowded indoor spaces. Stay at least 6 feet (2 meters) away from others. ? Disinfect objects and surfaces that are frequently touched every day. This may include:  Counters and tables.  Doorknobs and light switches.  Sinks and faucets.  Electronics, such as phones, remote controls, keyboards, computers, and tablets. To protect others: If you have symptoms of COVID-19, take steps to prevent the virus from spreading to others.  If you think you have a COVID-19 infection, contact your health care provider right away. Tell your health care team that you think you may have a COVID-19 infection.  Stay home. Leave your house only to seek medical care. Do not use public transport.  Do not travel while you are sick.  Wash your hands often with soap and water for 20 seconds. If soap and water are not available, use alcohol-based hand sanitizer.  Stay away from other members of your household. Let healthy  household members care for children and pets, if possible. If you have to care for children or pets, wash your hands  often and wear a mask. If possible, stay in your own room, separate from others. Use a different bathroom.  Make sure that all people in your household wash their hands well and often.  Cough or sneeze into a tissue or your sleeve or elbow. Do not cough or sneeze into your hand or into the air.  Wear a cloth face covering or face mask. Make sure your mask covers your nose and mouth. Where to find more information  Centers for Disease Control and Prevention: PurpleGadgets.be  World Health Organization: https://www.castaneda.info/ Contact a health care provider if:  You live in or have traveled to an area where COVID-19 is a risk and you have symptoms of the infection.  You have had contact with someone who has COVID-19 and you have symptoms of the infection. Get help right away if:  You have trouble breathing.  You have pain or pressure in your chest.  You have confusion.  You have bluish lips and fingernails.  You have difficulty waking from sleep.  You have symptoms that get worse. These symptoms may represent a serious problem that is an emergency. Do not wait to see if the symptoms will go away. Get medical help right away. Call your local emergency services (911 in the [place] Do not drive yourself to the hospital. Let the emergency medical personnel know if you think you have COVID-19. Summary  COVID-19 is a respiratory infection that is caused by a virus. It is also known as coronavirus disease or novel coronavirus. It can cause serious infections, such as pneumonia, acute respiratory distress syndrome, acute respiratory failure, or sepsis.  The virus that causes COVID-19 is contagious. This means that it can spread from person to person through droplets from breathing, speaking, singing, coughing, or sneezing.  You are  more likely to develop a serious illness if you are 38 years of age or older, have a weak immune system, live in a nursing home, or have chronic disease.  There is no medicine to treat COVID-19. Your health care provider will talk with you about ways to treat your symptoms.  Take steps to protect yourself and others from infection. Wash your hands often and disinfect objects and surfaces that are frequently touched every day. Stay away from people who are sick and wear a mask if you are sick. This information is not intended to replace advice given to you by your health care provider. Make sure you discuss any questions you have with your health care provider. Document Revised: 11/21/2018 Document Reviewed: 02/27/2018 Elsevier Patient Education  [place] Under Monitoring Name: Valerie Crane  Location: [address] [place] [address]   Infection Prevention Recommendations for Individuals Confirmed to have, or Being Evaluated for, 2019 Novel Coronavirus (COVID-19) Infection Who Receive Care at Home  Individuals who are confirmed to have, or are being evaluated for, COVID-19 should follow the prevention steps below until a healthcare provider or local or state health department says they can return to normal activities.  Stay home except to get medical care You should restrict activities outside your home, except for getting medical care. Do not go to work, school, or public areas, and do not use public transportation or taxis.  Call ahead before visiting your doctor Before your medical appointment, call the healthcare provider and tell them that you have, or are being evaluated for, COVID-19 infection. This will help the healthcare provider's office take steps to keep other  people from getting infected. Ask your healthcare provider to call the local or state health department.  Monitor your symptoms Seek prompt medical attention if your illness is  worsening (e.g., difficulty breathing). Before going to your medical appointment, call the healthcare provider and tell them that you have, or are being evaluated for, COVID-19 infection. Ask your healthcare provider to call the local or state health department.  Wear a facemask You should wear a facemask that covers your nose and mouth when you are in the same room with other people and when you visit a healthcare provider. People who live with or visit you should also wear a facemask while they are in the same room with you.  Separate yourself from other people in your home As much as possible, you should stay in a different room from other people in your home. Also, you should use a separate bathroom, if available.  Avoid sharing household items You should not share dishes, drinking glasses, cups, eating utensils, towels, bedding, or other items with other people in your home. After using these items, you should wash them thoroughly with soap and water.  Cover your coughs and sneezes Cover your mouth and nose with a tissue when you cough or sneeze, or you can cough or sneeze into your sleeve. Throw used tissues in a lined trash can, and immediately wash your hands with soap and water for at least 20 seconds or use an alcohol-based hand rub.  Wash your [employer] your hands often and thoroughly with soap and water for at least 20 seconds. You can use an alcohol-based hand sanitizer if soap and water are not available and if your hands are not visibly dirty. Avoid touching your eyes, nose, and mouth with unwashed hands.   Prevention Steps for Caregivers and Household Members of Individuals Confirmed to have, or Being Evaluated for, COVID-19 Infection Being Cared for in the Home  If you live with, or provide care at home for, a person confirmed to have, or being evaluated for, COVID-19 infection please follow these guidelines to prevent infection:  Follow healthcare provider's  instructions Make sure that you understand and can help the patient follow any healthcare provider instructions for all care.  Provide for the patient's basic needs You should help the patient with basic needs in the home and provide support for getting groceries, prescriptions, and other personal needs.  Monitor the patient's symptoms If they are getting sicker, call his or her medical provider and tell them that the patient has, or is being evaluated for, COVID-19 infection. This will help the healthcare provider's office take steps to keep other people from getting infected. Ask the healthcare provider to call the local or state health department.  Limit the number of people who have contact with the patient  If possible, have only one caregiver for the patient.  Other household members should stay in another home or place of residence. If this is not possible, they should stay  in another room, or be separated from the patient as much as possible. Use a separate bathroom, if available.  Restrict visitors who do not have an essential need to be in the home.  Keep older adults, very young children, and other sick people away from the patient Keep older adults, very young children, and those who have compromised immune systems or chronic health conditions away from the patient. This includes people with chronic heart, lung, or kidney conditions, diabetes, and cancer.  Ensure good ventilation Make  sure that shared spaces in the home have good air flow, such as from an air conditioner or an opened window, weather permitting.  Wash your hands often  Wash your hands often and thoroughly with soap and water for at least 20 seconds. You can use an alcohol based hand sanitizer if soap and water are not available and if your hands are not visibly dirty.  Avoid touching your eyes, nose, and mouth with unwashed hands.  Use disposable paper towels to dry your hands. If not available, use  dedicated cloth towels and replace them when they become wet.  Wear a facemask and gloves  Wear a disposable facemask at all times in the room and gloves when you touch or have contact with the patient's blood, body fluids, and/or secretions or excretions, such as sweat, saliva, sputum, nasal mucus, vomit, urine, or feces.  Ensure the mask fits over your nose and mouth tightly, and do not touch it during use.  Throw out disposable facemasks and gloves after using them. Do not reuse.  Wash your hands immediately after removing your facemask and gloves.  If your personal clothing becomes contaminated, carefully remove clothing and launder. Wash your hands after handling contaminated clothing.  Place all used disposable facemasks, gloves, and other waste in a lined container before disposing them with other household waste.  Remove gloves and wash your hands immediately after handling these items.  Do not share dishes, glasses, or other household items with the patient  Avoid sharing household items. You should not share dishes, drinking glasses, cups, eating utensils, towels, bedding, or other items with a patient who is confirmed to have, or being evaluated for, COVID-19 infection.  After the person uses these items, you should wash them thoroughly with soap and water.  Wash laundry thoroughly  Immediately remove and wash clothes or bedding that have blood, body fluids, and/or secretions or excretions, such as sweat, saliva, sputum, nasal mucus, vomit, urine, or feces, on them.  Wear gloves when handling laundry from the patient.  Read and follow directions on labels of laundry or clothing items and detergent. In general, wash and dry with the warmest temperatures recommended on the label.  Clean all areas the individual has used often  Clean all touchable surfaces, such as counters, tabletops, doorknobs, bathroom fixtures, toilets, phones, keyboards, tablets, and bedside tables, every  day. Also, clean any surfaces that may have blood, body fluids, and/or secretions or excretions on them.  Wear gloves when cleaning surfaces the patient has come in contact with.  Use a diluted bleach solution (e.g., dilute bleach with 1 part bleach and 10 parts water) or a household disinfectant with a label that says EPA-registered for coronaviruses. To make a bleach solution at home, add 1 tablespoon of bleach to 1 quart (4 cups) of water. For a larger supply, add  cup of bleach to 1 gallon (16 cups) of water.  Read labels of cleaning products and follow recommendations provided on product labels. Labels contain instructions for safe and effective use of the cleaning product including precautions you should take when applying the product, such as wearing gloves or eye protection and making sure you have good ventilation during use of the product.  Remove gloves and wash hands immediately after cleaning.  Monitor yourself for signs and symptoms of illness Caregivers and household members are considered close contacts, should monitor their health, and will be asked to limit movement outside of the home to the extent possible. Follow the monitoring  steps for close contacts listed on the symptom monitoring form.   ? If you have additional questions, contact your local health department or call the epidemiologist on call at [phone] (available 24/7). ? This guidance is subject to change. For the most up-to-date guidance from [facility], please refer to their website: [email or web address]

## 2019-03-04 NOTE — Progress Notes (Signed)
2 Days Post-Op  Subjective: CC: Abdominal pain Patient reports that her pain is significantly improved overnight.  Still some mild-moderate pain in her right lower quadrant that mainly occurs when she moves/ambulates.  She denies any nausea or vomiting.  She reports she is she is hungry.  She reports she is passing flatus.  No BM since admission.  Patient lost IV access overnight.  I was notified after I left the floor.  Per nursing staff patient refusing IV access at this time.  Objective: Vital signs in last 24 hours: Temp:  [98.2 F (36.8 C)-99.1 F (37.3 C)] 99.1 F (37.3 C) (01/27 0519) Pulse Rate:  [60-66] 60 (01/27 0519) Resp:  [18-20] 20 (01/27 0519) BP: (96-120)/(52-92) 110/66 (01/27 0519) SpO2:  [94 %-96 %] 94 % (01/27 0519) Weight:  [90.7 kg] 90.7 kg (01/26 2245) Last BM Date: 02/28/19  Intake/Output from previous day: 01/26 0701 - 01/27 0700 In: 1911.7 [I.V.:1511.7; IV Piggyback:400] Out: -  Intake/Output this shift: No intake/output data recorded.  PE: Gen:  Alert, NAD, pleasant Card:  RRR Pulm:  CTAB, no W/R/R, effort normal Abd: Soft, Nd, mild tenderness of the RLQ without r/r/g. No peritonitis. +BS Ext:  No LE edema  Psych: A&Ox3  Skin: no rashes noted, warm and dry  Lab Results:  Recent Labs    03/02/19 0740 03/03/19 0514  WBC 13.6* 6.5  HGB 13.3 11.5*  HCT 42.1 36.8  PLT 323 220   BMET Recent Labs    03/02/19 0740  NA 139  K 3.4*  CL 103  CO2 20*  GLUCOSE 132*  BUN 11  CREATININE 0.87  CALCIUM 9.1   PT/INR No results for input(s): LABPROT, INR in the last 72 hours. CMP     Component Value Date/Time   NA 139 03/02/2019 0740   NA 137 11/06/2018 1448   K 3.4 (L) 03/02/2019 0740   CL 103 03/02/2019 0740   CO2 20 (L) 03/02/2019 0740   GLUCOSE 132 (H) 03/02/2019 0740   BUN 11 03/02/2019 0740   BUN 10 11/06/2018 1448   CREATININE 0.87 03/02/2019 0740   CALCIUM 9.1 03/02/2019 0740   PROT 7.7 03/02/2019 0740   PROT 5.8 (L)  11/06/2018 1448   ALBUMIN 4.2 03/02/2019 0740   ALBUMIN 3.5 (L) 11/06/2018 1448   AST 26 03/02/2019 0740   ALT 34 03/02/2019 0740   ALKPHOS 71 03/02/2019 0740   BILITOT 0.6 03/02/2019 0740   BILITOT <0.2 11/06/2018 1448   GFRNONAA >60 03/02/2019 0740   GFRAA >60 03/02/2019 0740   Lipase     Component Value Date/Time   LIPASE 20 03/02/2019 0740       Studies/Results: CT Abdomen Pelvis W Contrast  Result Date: 03/02/2019 CLINICAL DATA:  Abdominal pain EXAM: CT ABDOMEN AND PELVIS WITH CONTRAST TECHNIQUE: Multidetector CT imaging of the abdomen and pelvis was performed using the standard protocol following bolus administration of intravenous contrast. CONTRAST:  OMNIPAQUE IOHEXOL 300 MG/ML  SOLN COMPARISON:  None. FINDINGS: Lower chest: There is bibasilar atelectasis. Hepatobiliary: No focal liver lesions are appreciable. Gallbladder wall is not appreciably thickened. There is no biliary duct dilatation. Pancreas: There is no pancreatic mass or inflammatory focus. Spleen: Spleen measures 14.8 x 13.2 x 7.7 cm with a measured splenic volume of 752 cubic cm. No focal splenic lesions are evident. Adrenals/Urinary Tract: Adrenals bilaterally appear normal. Kidneys bilaterally show no evident mass or hydronephrosis on either side. There is no evident renal or ureteral calculus on  either side. Urinary bladder is midline with wall thickness within normal limits. Stomach/Bowel: There is no appreciable bowel wall or mesenteric thickening. There are scattered colonic diverticula without diverticulitis. The terminal ileum appears normal. No evident bowel obstruction. There is no free air or portal venous air. Vascular/Lymphatic: There is no abdominal aortic aneurysm. No vascular lesions are evident. Major venous structures are patent. There is no adenopathy in the abdomen or pelvis. Reproductive: Uterus is anteverted. There is no pelvic mass evident. There is free fluid in the cul-de-sac. Other: The  appendix is distended, measuring 1.4 cm in diameter. There is soft tissue stranding along the course of the appendix. The wall of the appendix is mildly enhancing diffusely. There is no periappendiceal region abscess or perforation. No abscess evident in the abdomen or pelvis. Musculoskeletal: No blastic or lytic bone lesions are evident. There is no intramuscular or abdominal wall lesion. IMPRESSION: 1.  Evidence of acute appendicitis. Appendix: Location: Appendix arises from the cecum inferomedially, tracking transversely in the right mid pelvis with the tip slightly to the right of midline in mid pelvic region. Diameter: 1.4 cm Appendicolith: None Mucosal hyper-enhancement: Present Extraluminal gas: None Periappendiceal collection: None. There is soft tissue stranding along the course of the appendix. 2. Free fluid in the cul-de-sac. This fluid may be of reactive etiology given the appendicitis or could represent recent ovarian cyst rupture. 3. Scattered colonic diverticula without diverticulitis. No bowel obstruction. No abscess in the abdomen or pelvis. 4.  Splenomegaly of uncertain etiology.  No splenic lesions evident. 5. No renal or ureteral calculus. No hydronephrosis. Urinary bladder wall thickness normal. Critical Value/emergent results were called by telephone at the time of interpretation on 03/02/2019 at 2:11 pm to [facility], [place], who verbally acknowledged these results. Electronically Signed   By: Bretta Bang III M.D.   On: 03/02/2019 14:12    Anti-infectives: Anti-infectives (From admission, onward)   Start     Dose/Rate Route Frequency Ordered Stop   03/03/19 1515  cefTRIAXone (ROCEPHIN) 2 g in sodium chloride 0.9 % 100 mL IVPB  Status:  Discontinued     2 g 200 mL/hr over 30 Minutes Intravenous Every 24 hours 03/02/19 1839 03/03/19 0828   03/02/19 2315  metroNIDAZOLE (FLAGYL) IVPB 500 mg  Status:  Discontinued     500 mg 100 mL/hr over 60 Minutes Intravenous Every 8 hours  03/02/19 1839 03/03/19 0828   03/02/19 1515  cefTRIAXone (ROCEPHIN) 2 g in sodium chloride 0.9 % 100 mL IVPB     2 g 200 mL/hr over 30 Minutes Intravenous Every 24 hours 03/02/19 1509     03/02/19 1515  metroNIDAZOLE (FLAGYL) IVPB 500 mg     500 mg 100 mL/hr over 60 Minutes Intravenous Every 8 hours 03/02/19 1509         Assessment/Plan Hx preeclampsia - c-section 10/20 Diet controlled gestational diabetes Obesity BMI 40.5 Hypertension COVID+- asymptomatic. Discussed quarantining at home.  Patient lives at home with her mom, sister and newborn baby girl.  She has help with her daughter at home.  She is not breast-feeding.  Acute appendicitis - Given Covid diagnosis and increased risk of M/M with general anesthesia as well as increased risk for VTE etc, we will attempt to treat with IV antibiotics alone.  -WBC has normalized 1/26.  She is afebrile. Pain improved -Adv diet -Patient lost IV access. Can switch to oral abx. Total of 14 day course -If patient tolerates diet this afternoon and pain is controlled with  oral medications, can be discharged home with oral antibiotics and follow-up in the office.   FEN - CLD, Soft at lunch if tolerates CLD for breakfast. IVF till tolerates diet  VTE - SCDs, Lovenox  ID - Rocephin/Flagyl 1/25 - 1/27. Augmentin 1/27 >> Foley - None   LOS: 2 days    Jillyn Ledger , [facility] 03/04/2019, 8:16 AM Please see Amion for pager number during day hours 7:00am-4:30pm

## 2019-03-04 NOTE — Progress Notes (Signed)
Patient loss of IV access. Patient requested nurse to page provider and ask to switch to oral medications. Patient refused the charge nurse and IV team when asked if she would allow them to attempt.

## 2019-03-04 NOTE — Progress Notes (Signed)
Patient tolerated well soft diet. MD made aware. Will continue to monitor.

## 2019-03-04 NOTE — Progress Notes (Addendum)
Patient tolerated well Clear Liquid diet. No complaints. The diet was advanced to Soft diet per order. Will continue to monitor.

## 2019-03-25 ENCOUNTER — Ambulatory Visit: Payer: Self-pay | Admitting: General Surgery

## 2019-03-25 DIAGNOSIS — K3580 Unspecified acute appendicitis: Secondary | ICD-10-CM | POA: Diagnosis not present

## 2019-03-25 NOTE — H&P (Signed)
  Valerie Crane Documented: 03/25/2019 11:51 AM Location: [facility] Patient #: [id number] DOB: 1996-01-06 Single / Language: [place] / Race: White Female   History of Present Illness [name]; 03/25/2019 12:03 PM) The patient is a 24 year old female is here for a followup. Valerie Crane was hospitalized last month with acute appendicitis and COVID. Due to the acute COVID infection, she was treated with antibiotics and not surgery. Since discharge. She occasionally gets pains in her right lower quadrant which are crampy. She is also having some intermittent diarrhea. She has not had a severe pain across her upper abdomen that she had when she initially presented.   Allergies ([name], CMA; 03/25/2019 11:56 AM) No Known Drug Allergies [03/25/2019]:  Medication History ([name], CMA; 03/25/2019 11:58 AM) Norvasc (10MG  Tablet, Oral) Active. Medications Reconciled  Vitals ([name] CMA; 03/25/2019 11:56 AM) 03/25/2019 11:56 AM Weight: 252.38 lb Height: 69in Body Surface Area: 2.28 m Body Mass Index: 37.27 kg/m  Temp.: 98.73F  Pulse: 124 (Regular)  P.OX: 98% (Room air) BP: 130/90 (Sitting, Left Arm, Standard)       Physical Exam 03/27/2019 E. Laurell Josephs MD; 03/25/2019 12:04 PM) General Mental Status-Alert. General Appearance-Consistent with stated age. Hydration-Well hydrated. Voice-Normal.  Head and Neck Head-normocephalic, atraumatic with no lesions or palpable masses. Trachea-midline. Thyroid Gland Characteristics - normal size and consistency.  Eye Eyeball - Bilateral-Extraocular movements intact. Sclera/Conjunctiva - Bilateral-No scleral icterus.  Chest and Lung Exam Chest and lung exam reveals -quiet, even and easy respiratory effort with no use of accessory muscles and on auscultation, normal breath sounds, no adventitious sounds and normal vocal resonance. Inspection Chest Wall - Normal. Back -  normal.  Cardiovascular Cardiovascular examination reveals -normal heart sounds, regular rate and rhythm with no murmurs and normal pedal pulses bilaterally.  Abdomen Inspection Inspection of the abdomen reveals - No Hernias. Palpation/Percussion Palpation and Percussion of the abdomen reveal - Soft, No Rebound tenderness, No Rigidity (guarding) and No hepatosplenomegaly. Note: Minimal tenderness to deep palpation right lower quadrant. Auscultation Auscultation of the abdomen reveals - Bowel sounds normal.  Neurologic Neurologic evaluation reveals -alert and oriented x 3 with no impairment of recent or remote memory. Mental Status-Normal.  Musculoskeletal Global Assessment -Note: no gross deformities.  Normal Exam - Left-Upper Extremity Strength Normal and Lower Extremity Strength Normal. Normal Exam - Right-Upper Extremity Strength Normal and Lower Extremity Strength Normal.  Lymphatic Head & Neck  General Head & Neck Lymphatics: Bilateral - Description - Normal. Axillary  General Axillary Region: Bilateral - Description - Normal. Tenderness - Non Tender. Femoral & Inguinal  Generalized Femoral & Inguinal Lymphatics: Bilateral - Description - No Generalized lymphadenopathy.    Assessment & Plan 03/27/2019 E. Laurell Josephs MD; 03/25/2019 12:05 PM) ACUTE APPENDICITIS, UNCOMPLICATED (K35.80) Impression: This was initially treated with antibiotics due to her concomitant COVID infection. She has recovered from both but still has some intermittent pains in the right lower quadrant. I recommended proceeding with laparoscopic appendectomy. I discussed the procedure, risks, and benefits with her. She is agreeable as she does not want the appendicitis to return. I answered her questions and we will forward to scheduling as an outpatient procedure.

## 2019-06-03 ENCOUNTER — Ambulatory Visit: Payer: Self-pay | Admitting: General Surgery

## 2019-06-03 DIAGNOSIS — K36 Other appendicitis: Secondary | ICD-10-CM | POA: Diagnosis not present

## 2019-06-03 NOTE — H&P (View-Only) (Signed)
  Valerie Crane Appointment: 06/03/2019 9:10 AM Location: [facility] Patient #: [id number] DOB: 1995-06-26 Single / Language: [place] / Race: White Female   History of Present Illness Valerie Josephs E. [name] MD; 06/03/2019 9:24 AM) The patient is a 24 year old female is here for a followup. She was admitted to the hospital in January with appendicitis. She was also diagnosed with COVID at that time. Because of this, she was treated with antibiotics for her appendicitis. I saw her back last month and discussed interval appendectomy. She agreed and we planned to schedule her for outpatient labs Again appendectomy. She had some financial concerns so she did not schedule at that time. Since then, she has had 3 episodes of recurrent right lower quadrant pain with associated diarrhea. She would like to discuss scheduling at this time.   Medication History (Armen Emelda Fear, CMA; 06/03/2019 9:17 AM) Norvasc (10MG  Tablet, Oral) Active. Medications Reconciled  Vitals ([name] CMA; 06/03/2019 9:17 AM) 06/03/2019 9:16 AM Weight: 247.38 lb Height: 69in Body Surface Area: 2.26 m Body Mass Index: 36.53 kg/m  Temp.: 98.26F  Pulse: 140 (Regular)  P.OX: 98% (Room air) BP: 124/96(Sitting, Left Arm, Standard)       Physical Exam 06/05/2019 E. Valerie Josephs MD; 06/03/2019 9:25 AM) General Note: No distress   Integumentary Note: Warm and dry   Head and Neck Note: Normocephalic   Chest and Lung Exam Note: Clear to auscultation bilaterally, no wheezing   Cardiovascular Note: Regular rate and rhythm, peripheral pulses are full, no significant edema   Abdomen Note: Soft, nontender but does have a feeling of a little bit of pressure on palpation of the right lower quadrant, no masses   Neurologic Note: Alert and oriented     Assessment & Plan 06/05/2019 E. Valerie Josephs MD; 06/03/2019 9:27 AM) CHRONIC APPENDICITIS (K36) Impression: I have again offered  laparoscopic appendectomy as an outpatient surgical procedure. We again discussed the procedure, risks, and benefits. We also discussed expected postoperative course. We will proceed with scheduling. She is agreeable. I answered her questions.

## 2019-06-03 NOTE — H&P (Signed)
  Laurice L Buhman Appointment: 06/03/2019 9:10 AM Location: [facility] Patient #: [id number] DOB: 10/08/1995 Single / Language: English / Race: White Female   History of Present Illness (Brendan Gruwell E. Keeon Zurn MD; 06/03/2019 9:24 AM) The patient is a 24 year old female is here for a followup. She was admitted to the hospital in January with appendicitis. She was also diagnosed with COVID at that time. Because of this, she was treated with antibiotics for her appendicitis. I saw her back last month and discussed interval appendectomy. She agreed and we planned to schedule her for outpatient labs Again appendectomy. She had some financial concerns so she did not schedule at that time. Since then, she has had 3 episodes of recurrent right lower quadrant pain with associated diarrhea. She would like to discuss scheduling at this time.   Medication History ([name], CMA; 06/03/2019 9:17 AM) Norvasc (10MG Tablet, Oral) Active. Medications Reconciled  Vitals ([name] CMA; 06/03/2019 9:17 AM) 06/03/2019 9:16 AM Weight: 247.38 lb Height: 69in Body Surface Area: 2.26 m Body Mass Index: 36.53 kg/m  Temp.: 98.2F  Pulse: 140 (Regular)  P.OX: 98% (Room air) BP: 124/96(Sitting, Left Arm, Standard)       Physical Exam (Sammuel Blick E. Magdalina Whitehead MD; 06/03/2019 9:25 AM) General Note: No distress   Integumentary Note: Warm and dry   Head and Neck Note: Normocephalic   Chest and Lung Exam Note: Clear to auscultation bilaterally, no wheezing   Cardiovascular Note: Regular rate and rhythm, peripheral pulses are full, no significant edema   Abdomen Note: Soft, nontender but does have a feeling of a little bit of pressure on palpation of the right lower quadrant, no masses   Neurologic Note: Alert and oriented     Assessment & Plan (Rorik Vespa E. Josefa Syracuse MD; 06/03/2019 9:27 AM) CHRONIC APPENDICITIS (K36) Impression: I have again offered  laparoscopic appendectomy as an outpatient surgical procedure. We again discussed the procedure, risks, and benefits. We also discussed expected postoperative course. We will proceed with scheduling. She is agreeable. I answered her questions. 

## 2019-06-05 ENCOUNTER — Other Ambulatory Visit: Payer: Self-pay

## 2019-06-05 ENCOUNTER — Encounter (HOSPITAL_BASED_OUTPATIENT_CLINIC_OR_DEPARTMENT_OTHER): Payer: Self-pay | Admitting: General Surgery

## 2019-06-05 ENCOUNTER — Other Ambulatory Visit (HOSPITAL_COMMUNITY): Payer: BC Managed Care – PPO

## 2019-06-06 ENCOUNTER — Inpatient Hospital Stay (HOSPITAL_COMMUNITY): Admission: RE | Admit: 2019-06-06 | Payer: BC Managed Care – PPO | Source: Ambulatory Visit

## 2019-06-08 ENCOUNTER — Other Ambulatory Visit: Payer: Self-pay

## 2019-06-08 ENCOUNTER — Other Ambulatory Visit (HOSPITAL_COMMUNITY)
Admission: RE | Admit: 2019-06-08 | Discharge: 2019-06-08 | Disposition: A | Discharge status: Home / Self Care | Payer: BC Managed Care – PPO | Source: Ambulatory Visit | Attending: General Surgery | Admitting: General Surgery

## 2019-06-08 ENCOUNTER — Encounter (HOSPITAL_COMMUNITY): Payer: Self-pay | Admitting: General Surgery

## 2019-06-08 DIAGNOSIS — Z20822 Contact with and (suspected) exposure to covid-19: Secondary | ICD-10-CM | POA: Diagnosis not present

## 2019-06-08 DIAGNOSIS — Z01812 Encounter for preprocedural laboratory examination: Secondary | ICD-10-CM | POA: Insufficient documentation

## 2019-06-08 LAB — SARS CORONAVIRUS 2 (TAT 6-24 HRS): SARS Coronavirus 2: NEGATIVE

## 2019-06-08 NOTE — Progress Notes (Signed)
Pre-op call completed

## 2019-06-09 ENCOUNTER — Other Ambulatory Visit: Payer: Self-pay

## 2019-06-09 ENCOUNTER — Ambulatory Visit (HOSPITAL_COMMUNITY): Payer: BC Managed Care – PPO | Admitting: Anesthesiology

## 2019-06-09 ENCOUNTER — Ambulatory Visit (HOSPITAL_COMMUNITY)
Admission: RE | Admit: 2019-06-09 | Discharge: 2019-06-09 | Disposition: A | Discharge status: Home / Self Care | Payer: BC Managed Care – PPO | Attending: General Surgery | Admitting: General Surgery

## 2019-06-09 ENCOUNTER — Encounter (HOSPITAL_COMMUNITY)
Admission: RE | Disposition: A | Discharge status: Home / Self Care | Payer: Self-pay | Source: Home / Self Care | Attending: General Surgery

## 2019-06-09 ENCOUNTER — Encounter (HOSPITAL_COMMUNITY): Payer: Self-pay | Admitting: General Surgery

## 2019-06-09 DIAGNOSIS — Z87891 Personal history of nicotine dependence: Secondary | ICD-10-CM | POA: Diagnosis not present

## 2019-06-09 DIAGNOSIS — Z79899 Other long term (current) drug therapy: Secondary | ICD-10-CM | POA: Diagnosis not present

## 2019-06-09 DIAGNOSIS — K3580 Unspecified acute appendicitis: Secondary | ICD-10-CM | POA: Insufficient documentation

## 2019-06-09 DIAGNOSIS — Z8616 Personal history of COVID-19: Secondary | ICD-10-CM | POA: Diagnosis not present

## 2019-06-09 DIAGNOSIS — Z6833 Body mass index (BMI) 33.0-33.9, adult: Secondary | ICD-10-CM | POA: Insufficient documentation

## 2019-06-09 DIAGNOSIS — E119 Type 2 diabetes mellitus without complications: Secondary | ICD-10-CM | POA: Insufficient documentation

## 2019-06-09 DIAGNOSIS — K36 Other appendicitis: Secondary | ICD-10-CM | POA: Diagnosis present

## 2019-06-09 HISTORY — PX: LAPAROSCOPIC APPENDECTOMY: SHX408

## 2019-06-09 HISTORY — DX: Gestational diabetes mellitus in pregnancy, unspecified control: O24.419

## 2019-06-09 LAB — CBC
HCT: 38.5 % (ref 36.0–46.0)
Hemoglobin: 12.3 g/dL (ref 12.0–15.0)
MCH: 29.4 pg (ref 26.0–34.0)
MCHC: 31.9 g/dL (ref 30.0–36.0)
MCV: 91.9 fL (ref 80.0–100.0)
Platelets: 325 10*3/uL (ref 150–400)
RBC: 4.19 MIL/uL (ref 3.87–5.11)
RDW: 14.1 % (ref 11.5–15.5)
WBC: 10.2 10*3/uL (ref 4.0–10.5)
nRBC: 0 % (ref 0.0–0.2)

## 2019-06-09 LAB — POCT PREGNANCY, URINE: Preg Test, Ur: NEGATIVE

## 2019-06-09 SURGERY — APPENDECTOMY, LAPAROSCOPIC
Anesthesia: General

## 2019-06-09 MED ORDER — SCOPOLAMINE 1 MG/3DAYS TD PT72
1.0000 | MEDICATED_PATCH | TRANSDERMAL | Status: DC
  Administered 2019-06-09: 08:00:00 1.5 mg via TRANSDERMAL
  Filled 2019-06-09: qty 1

## 2019-06-09 MED ORDER — FENTANYL CITRATE (PF) 100 MCG/2ML IJ SOLN: 25.0000 ug | INTRAMUSCULAR | Status: DC | PRN

## 2019-06-09 MED ORDER — ENSURE PRE-SURGERY PO LIQD: 296.0000 mL | Freq: Once | ORAL | Status: DC

## 2019-06-09 MED ORDER — HYDROMORPHONE HCL 1 MG/ML IJ SOLN
0.2500 mg | INTRAMUSCULAR | Status: DC | PRN
  Administered 2019-06-09 (×3): 0.5 mg via INTRAVENOUS

## 2019-06-09 MED ORDER — LACTATED RINGERS IV SOLN: INTRAVENOUS | Status: DC

## 2019-06-09 MED ORDER — PROMETHAZINE HCL 25 MG/ML IJ SOLN: 6.2500 mg | INTRAMUSCULAR | Status: DC | PRN

## 2019-06-09 MED ORDER — CHLORHEXIDINE GLUCONATE CLOTH 2 % EX PADS: 6.0000 | MEDICATED_PAD | Freq: Once | CUTANEOUS | Status: DC

## 2019-06-09 MED ORDER — PROPOFOL 10 MG/ML IV BOLUS
INTRAVENOUS | Status: AC
  Filled 2019-06-09: qty 20

## 2019-06-09 MED ORDER — MIDAZOLAM HCL 5 MG/5ML IJ SOLN
INTRAMUSCULAR | Status: DC | PRN
  Administered 2019-06-09: 2 mg via INTRAVENOUS

## 2019-06-09 MED ORDER — DEXAMETHASONE SODIUM PHOSPHATE 10 MG/ML IJ SOLN
INTRAMUSCULAR | Status: AC
  Filled 2019-06-09: qty 1

## 2019-06-09 MED ORDER — MEPERIDINE HCL 25 MG/ML IJ SOLN: 6.2500 mg | INTRAMUSCULAR | Status: DC | PRN

## 2019-06-09 MED ORDER — HYDROMORPHONE HCL 1 MG/ML IJ SOLN
INTRAMUSCULAR | Status: AC
  Filled 2019-06-09: qty 1

## 2019-06-09 MED ORDER — FENTANYL CITRATE (PF) 100 MCG/2ML IJ SOLN
INTRAMUSCULAR | Status: DC | PRN
  Administered 2019-06-09: 50 ug via INTRAVENOUS
  Administered 2019-06-09: 150 ug via INTRAVENOUS
  Administered 2019-06-09: 50 ug via INTRAVENOUS

## 2019-06-09 MED ORDER — DEXAMETHASONE SODIUM PHOSPHATE 10 MG/ML IJ SOLN
INTRAMUSCULAR | Status: DC | PRN
Start: 2019-06-09 — End: 2019-06-09
  Administered 2019-06-09: 10 mg via INTRAVENOUS

## 2019-06-09 MED ORDER — ROCURONIUM BROMIDE 10 MG/ML (PF) SYRINGE
PREFILLED_SYRINGE | INTRAVENOUS | Status: DC | PRN
  Administered 2019-06-09: 60 mg via INTRAVENOUS

## 2019-06-09 MED ORDER — GABAPENTIN 300 MG PO CAPS
300.0000 mg | ORAL_CAPSULE | ORAL | Status: AC
  Administered 2019-06-09: 08:00:00 300 mg via ORAL
  Filled 2019-06-09: qty 1

## 2019-06-09 MED ORDER — FENTANYL CITRATE (PF) 250 MCG/5ML IJ SOLN
INTRAMUSCULAR | Status: AC
  Filled 2019-06-09: qty 5

## 2019-06-09 MED ORDER — SODIUM CHLORIDE 0.9 % IV SOLN
2.0000 g | INTRAVENOUS | Status: AC
  Administered 2019-06-09: 2 g via INTRAVENOUS
  Filled 2019-06-09: qty 2

## 2019-06-09 MED ORDER — PROPOFOL 10 MG/ML IV BOLUS
INTRAVENOUS | Status: DC | PRN
  Administered 2019-06-09: 200 mg via INTRAVENOUS

## 2019-06-09 MED ORDER — SUGAMMADEX SODIUM 200 MG/2ML IV SOLN
INTRAVENOUS | Status: DC | PRN
  Administered 2019-06-09: 200 mg via INTRAVENOUS

## 2019-06-09 MED ORDER — ONDANSETRON HCL 4 MG/2ML IJ SOLN
INTRAMUSCULAR | Status: AC
  Filled 2019-06-09: qty 2

## 2019-06-09 MED ORDER — BUPIVACAINE HCL (PF) 0.25 % IJ SOLN
INTRAMUSCULAR | Status: AC
  Filled 2019-06-09: qty 30

## 2019-06-09 MED ORDER — LIDOCAINE 2% (20 MG/ML) 5 ML SYRINGE
INTRAMUSCULAR | Status: AC
  Filled 2019-06-09: qty 5

## 2019-06-09 MED ORDER — ACETAMINOPHEN 500 MG PO TABS
1000.0000 mg | ORAL_TABLET | ORAL | Status: AC
  Administered 2019-06-09: 1000 mg via ORAL
  Filled 2019-06-09: qty 2

## 2019-06-09 MED ORDER — CELECOXIB 200 MG PO CAPS
400.0000 mg | ORAL_CAPSULE | ORAL | Status: AC
  Administered 2019-06-09: 400 mg via ORAL
  Filled 2019-06-09: qty 2

## 2019-06-09 MED ORDER — MIDAZOLAM HCL 2 MG/2ML IJ SOLN
INTRAMUSCULAR | Status: AC
  Filled 2019-06-09: qty 2

## 2019-06-09 MED ORDER — ROCURONIUM BROMIDE 10 MG/ML (PF) SYRINGE
PREFILLED_SYRINGE | INTRAVENOUS | Status: AC
  Filled 2019-06-09: qty 10

## 2019-06-09 MED ORDER — 0.9 % SODIUM CHLORIDE (POUR BTL) OPTIME
TOPICAL | Status: DC | PRN
  Administered 2019-06-09: 10:00:00 1000 mL

## 2019-06-09 MED ORDER — MIDAZOLAM HCL 2 MG/2ML IJ SOLN: 0.5000 mg | Freq: Once | INTRAMUSCULAR | Status: DC | PRN

## 2019-06-09 MED ORDER — ONDANSETRON HCL 4 MG/2ML IJ SOLN
INTRAMUSCULAR | Status: DC | PRN
  Administered 2019-06-09: 4 mg via INTRAVENOUS

## 2019-06-09 MED ORDER — OXYCODONE HCL 5 MG PO TABS: 5.0000 mg | ORAL_TABLET | Freq: Four times a day (QID) | ORAL | 0 refills | Status: DC | PRN

## 2019-06-09 MED ORDER — BUPIVACAINE HCL 0.25 % IJ SOLN
INTRAMUSCULAR | Status: DC | PRN
  Administered 2019-06-09: 15 mL

## 2019-06-09 MED ORDER — SODIUM CHLORIDE 0.9 % IR SOLN
Status: DC | PRN
  Administered 2019-06-09: 1000 mL

## 2019-06-09 MED ORDER — LIDOCAINE 2% (20 MG/ML) 5 ML SYRINGE
INTRAMUSCULAR | Status: DC | PRN
  Administered 2019-06-09: 40 mg via INTRAVENOUS

## 2019-06-09 SURGICAL SUPPLY — 50 items
ADH SKN CLS APL DERMABOND .7 (GAUZE/BANDAGES/DRESSINGS) ×1
ADH SKN CLS LQ APL DERMABOND (GAUZE/BANDAGES/DRESSINGS) ×1
APL PRP STRL LF DISP 70% ISPRP (MISCELLANEOUS) ×1
APPLIER CLIP ROT 10 11.4 M/L (STAPLE) ×3
APR CLP MED LRG 11.4X10 (STAPLE) ×1
BAG SPEC RTRVL 10 TROC 200 (ENDOMECHANICALS) ×1
CANISTER SUCT 3000ML PPV (MISCELLANEOUS) ×3 IMPLANT
CHLORAPREP W/TINT 26 (MISCELLANEOUS) ×3 IMPLANT
CLIP APPLIE ROT 10 11.4 M/L (STAPLE) IMPLANT
COVER SURGICAL LIGHT HANDLE (MISCELLANEOUS) ×3 IMPLANT
COVER WAND RF STERILE (DRAPES) ×3 IMPLANT
CUTTER FLEX LINEAR 45M (STAPLE) ×3 IMPLANT
DERMABOND ADHESIVE PROPEN (GAUZE/BANDAGES/DRESSINGS) ×2
DERMABOND ADVANCED (GAUZE/BANDAGES/DRESSINGS) ×2
DERMABOND ADVANCED .7 DNX12 (GAUZE/BANDAGES/DRESSINGS) ×1 IMPLANT
DERMABOND ADVANCED .7 DNX6 (GAUZE/BANDAGES/DRESSINGS) ×1 IMPLANT
ELECT REM PT RETURN 9FT ADLT (ELECTROSURGICAL) ×3
ELECTRODE REM PT RTRN 9FT ADLT (ELECTROSURGICAL) ×1 IMPLANT
GLOVE BIO SURGEON STRL SZ8 (GLOVE) ×3 IMPLANT
GLOVE BIOGEL PI IND STRL 8 (GLOVE) ×1 IMPLANT
GLOVE BIOGEL PI INDICATOR 8 (GLOVE) ×2
GOWN STRL REUS W/ TWL LRG LVL3 (GOWN DISPOSABLE) ×2 IMPLANT
GOWN STRL REUS W/ TWL XL LVL3 (GOWN DISPOSABLE) ×1 IMPLANT
GOWN STRL REUS W/TWL LRG LVL3 (GOWN DISPOSABLE) ×6
GOWN STRL REUS W/TWL XL LVL3 (GOWN DISPOSABLE) ×3
KIT BASIN OR (CUSTOM PROCEDURE TRAY) ×3 IMPLANT
KIT TURNOVER KIT B (KITS) ×3 IMPLANT
NEEDLE 22X1 1/2 (OR ONLY) (NEEDLE) ×3 IMPLANT
NS IRRIG 1000ML POUR BTL (IV SOLUTION) ×3 IMPLANT
PAD ARMBOARD 7.5X6 YLW CONV (MISCELLANEOUS) ×6 IMPLANT
POUCH RETRIEVAL ECOSAC 10 (ENDOMECHANICALS) ×1 IMPLANT
POUCH RETRIEVAL ECOSAC 10MM (ENDOMECHANICALS) ×3
RELOAD 45 VASCULAR/THIN (ENDOMECHANICALS) IMPLANT
RELOAD STAPLE 45 2.5 WHT GRN (ENDOMECHANICALS) IMPLANT
RELOAD STAPLE TA45 3.5 REG BLU (ENDOMECHANICALS) IMPLANT
SCISSORS LAP 5X35 DISP (ENDOMECHANICALS) IMPLANT
SET IRRIG TUBING LAPAROSCOPIC (IRRIGATION / IRRIGATOR) ×3 IMPLANT
SET TUBE SMOKE EVAC HIGH FLOW (TUBING) ×3 IMPLANT
SHEARS HARMONIC ACE PLUS 36CM (ENDOMECHANICALS) ×3 IMPLANT
SPECIMEN JAR SMALL (MISCELLANEOUS) ×3 IMPLANT
SUT VIC AB 4-0 PS2 27 (SUTURE) ×3 IMPLANT
TOWEL GREEN STERILE (TOWEL DISPOSABLE) ×3 IMPLANT
TOWEL GREEN STERILE FF (TOWEL DISPOSABLE) ×3 IMPLANT
TRAY FOLEY W/BAG SLVR 16FR (SET/KITS/TRAYS/PACK) ×3
TRAY FOLEY W/BAG SLVR 16FR ST (SET/KITS/TRAYS/PACK) ×1 IMPLANT
TRAY LAPAROSCOPIC MC (CUSTOM PROCEDURE TRAY) ×3 IMPLANT
TROCAR XCEL 12X100 BLDLESS (ENDOMECHANICALS) ×3 IMPLANT
TROCAR XCEL BLUNT TIP 100MML (ENDOMECHANICALS) ×3 IMPLANT
TROCAR XCEL NON-BLD 5MMX100MML (ENDOMECHANICALS) ×3 IMPLANT
WATER STERILE IRR 1000ML POUR (IV SOLUTION) ×3 IMPLANT

## 2019-06-09 NOTE — Anesthesia Preprocedure Evaluation (Addendum)
Anesthesia Evaluation  Patient identified by MRN, date of birth, ID band Patient awake    Reviewed: Allergy & Precautions, NPO status , Patient's Chart, lab work & pertinent test results  History of Anesthesia Complications Negative for: history of anesthetic complications  Airway Mallampati: II  TM Distance: >3 FB Neck ROM: Full    Dental  (+) Dental Advisory Given, Teeth Intact   Pulmonary Patient abstained from smoking., former smoker,  06/08/2019 SARS coronavirus NEG  COVID-19 positive 02/2019   breath sounds clear to auscultation       Cardiovascular negative cardio ROS   Rhythm:Regular Rate:Normal     Neuro/Psych negative neurological ROS     GI/Hepatic negative GI ROS, Neg liver ROS,   Endo/Other  diabetes, GestationalMorbid obesity  Renal/GU negative Renal ROS     Musculoskeletal   Abdominal (+) + obese,   Peds  Hematology negative hematology ROS (+)   Anesthesia Other Findings   Reproductive/Obstetrics                            Anesthesia Physical Anesthesia Plan  ASA: II  Anesthesia Plan: General   Post-op Pain Management:    Induction: Intravenous  PONV Risk Score and Plan: 3 and Ondansetron, Dexamethasone and Scopolamine patch - Pre-op  Airway Management Planned: Oral ETT  Additional Equipment:   Intra-op Plan:   Post-operative Plan: Extubation in OR  Informed Consent: I have reviewed the patients History and Physical, chart, labs and discussed the procedure including the risks, benefits and alternatives for the proposed anesthesia with the patient or authorized representative who has indicated his/her understanding and acceptance.     Dental advisory given  Plan Discussed with: CRNA and Surgeon  Anesthesia Plan Comments:        Anesthesia Quick Evaluation

## 2019-06-09 NOTE — Transfer of Care (Signed)
Immediate Anesthesia Transfer of Care Note  Patient: Rosemary Holms  Procedure(s) Performed: LAPAROSCOPIC APPENDECTOMY (N/A )  Patient Location: PACU  Anesthesia Type:General  Level of Consciousness: oriented, drowsy and patient cooperative  Airway & Oxygen Therapy: Patient Spontanous Breathing and Patient connected to nasal cannula oxygen  Post-op Assessment: Report given to RN and Post -op Vital signs reviewed and stable  Post vital signs: Reviewed  Last Vitals:  Vitals Value Taken Time  BP 121/82 06/09/19 1048  Temp    Pulse    Resp 20 06/09/19 1047  SpO2    Vitals shown include unvalidated device data.  Last Pain:  Vitals:   06/09/19 0801  TempSrc:   PainSc: 0-No pain      Patients Stated Pain Goal: 5 (06/09/19 0801)  Complications: No apparent anesthesia complications

## 2019-06-09 NOTE — Anesthesia Procedure Notes (Signed)
Procedure Name: Intubation Date/Time: 06/09/2019 9:35 AM Performed by: Lovie Chol, CRNA Pre-anesthesia Checklist: Patient identified, Emergency Drugs available, Suction available and Patient being monitored Patient Re-evaluated:Patient Re-evaluated prior to induction Oxygen Delivery Method: Circle System Utilized Preoxygenation: Pre-oxygenation with 100% oxygen Induction Type: IV induction Ventilation: Mask ventilation without difficulty Laryngoscope Size: Miller and 2 Grade View: Grade I Tube type: Oral Tube size: 7.0 mm Number of attempts: 1 Airway Equipment and Method: Stylet Placement Confirmation: ETT inserted through vocal cords under direct vision,  positive ETCO2 and breath sounds checked- equal and bilateral Secured at: 21 cm Tube secured with: Tape Dental Injury: Teeth and Oropharynx as per pre-operative assessment

## 2019-06-09 NOTE — Anesthesia Postprocedure Evaluation (Signed)
Anesthesia Post Note  Patient: Valerie Crane  Procedure(s) Performed: LAPAROSCOPIC APPENDECTOMY (N/A )     Patient location during evaluation: PACU Anesthesia Type: General Level of consciousness: awake and alert, oriented and patient cooperative Pain management: pain level controlled Vital Signs Assessment: post-procedure vital signs reviewed and stable Respiratory status: spontaneous breathing, nonlabored ventilation and respiratory function stable Cardiovascular status: blood pressure returned to baseline and stable Postop Assessment: no apparent nausea or vomiting Anesthetic complications: no    Last Vitals:  Vitals:   06/09/19 1131 06/09/19 1134  BP: 119/79 119/79  Pulse: 60 (!) 58  Resp: 16 15  Temp: (!) 36.3 C   SpO2: 93% 92%    Last Pain:  Vitals:   06/09/19 1131  TempSrc:   PainSc: 4                  [name],E. Rakel Junio

## 2019-06-09 NOTE — Interval H&P Note (Signed)
History and Physical Interval Note:  06/09/2019 8:25 AM  Valerie Crane  has presented today for surgery, with the diagnosis of CHRONIC APPENDICITIS.  The various methods of treatment have been discussed with the patient and family. After consideration of risks, benefits and other options for treatment, the patient has consented to  Procedure(s): LAPAROSCOPIC APPENDECTOMY (N/A) as a surgical intervention.  The patient's history has been reviewed, patient examined, no change in status, stable for surgery.  I have reviewed the patient's chart and labs.  Questions were answered to the patient's satisfaction.     Liz Malady

## 2019-06-09 NOTE — Op Note (Signed)
  06/09/2019  10:25 AM  PATIENT:  Valerie Crane  24 y.o. female  PRE-OPERATIVE DIAGNOSIS:  CHRONIC APPENDICITIS  POST-OPERATIVE DIAGNOSIS:  CHRONIC APPENDICITIS  PROCEDURE:  Procedure(s): LAPAROSCOPIC APPENDECTOMY  SURGEON:  Surgeon(s): Violeta Gelinas, MD  ASSISTANTS: Patrici Ranks, RNFA   ANESTHESIA:   local and general  EBL:  Total I/O In: 100 [IV Piggyback:100] Out: 5 [Blood:5]  BLOOD ADMINISTERED:none  DRAINS: none   SPECIMEN:  Excision  DISPOSITION OF SPECIMEN:  PATHOLOGY  COUNTS:  YES  DICTATION: .Dragon Dictation Procedure in detail: Informed consent was obtained.  She received intravenous antibiotics.  She was brought to the operating room and general endotracheal anesthesia was administered by the anesthesia staff.  Her abdomen was prepped and draped in a sterile fashion.  Timeout procedure was performed.The infraumbilical region was infiltrated with local. Infraumbilical incision was made. Subcutaneous tissues were dissected down revealing the anterior fascia. This was divided sharply along the midline. Peritoneal cavity was entered under direct vision without complication. A 0 Vicryl pursestring was placed around the fascial opening. Hassan trocar was inserted into the abdomen. The abdomen was insufflated with carbon dioxide in standard fashion. Under direct vision a 12 mm left lower quadrant and a 5 mm right mid abdomen port were placed.  Local was used at each port site.  The appendix was identified in the right lower quadrant.  The mesoappendix was divided with the harmonic scalpel achieving excellent hemostasis.  The base of the appendix was divided with Endo GIA with a vascular load.  The appendix was placed in a bag and removed from the abdomen via a port site.  It was sent to pathology.  The right lower quadrant was copiously irrigated.  Hemostasis was ensured.  The staple line was intact and looked good.  The small bowel was run back for 3 or 4 feet from the  terminal ileum and I did not see any abnormalities.  Four-quadrant inspection revealed no other abnormalities or complicating features.  Irrigation fluid was evacuated.  Ports were removed under direct vision.  Pneumoperitoneum was released.  Infraumbilical fascia was closed by tying the pursestring.  All 3 wounds were irrigated and the skin of each was closed with 4-0 Vicryl followed by Dermabond.  All counts were correct.  She tolerated procedure well without apparent complication and was taken recovery in stable condition.  PATIENT DISPOSITION:  PACU - hemodynamically stable.   Delay start of Pharmacological VTE agent (>24hrs) due to surgical blood loss or risk of bleeding:  no  Violeta Gelinas, MD, MPH, FACS Pager: [phone]  5/4/202110:25 AM

## 2019-06-10 LAB — SURGICAL PATHOLOGY

## 2019-09-07 ENCOUNTER — Ambulatory Visit: Payer: BC Managed Care – PPO | Admitting: Family Medicine

## 2019-09-07 ENCOUNTER — Encounter: Payer: Self-pay | Admitting: Family Medicine

## 2019-09-07 ENCOUNTER — Other Ambulatory Visit: Payer: Self-pay

## 2019-09-07 VITALS — BP 120/70 | HR 94 | Temp 98.0°F | Ht 67.5 in | Wt 241.0 lb

## 2019-09-07 DIAGNOSIS — Z7689 Persons encountering health services in other specified circumstances: Secondary | ICD-10-CM

## 2019-09-07 DIAGNOSIS — F419 Anxiety disorder, unspecified: Secondary | ICD-10-CM | POA: Diagnosis not present

## 2019-09-07 DIAGNOSIS — F329 Major depressive disorder, single episode, unspecified: Secondary | ICD-10-CM

## 2019-09-07 DIAGNOSIS — F32A Depression, unspecified: Secondary | ICD-10-CM

## 2019-09-07 DIAGNOSIS — Z6837 Body mass index (BMI) 37.0-37.9, adult: Secondary | ICD-10-CM

## 2019-09-07 DIAGNOSIS — E6609 Other obesity due to excess calories: Secondary | ICD-10-CM | POA: Diagnosis not present

## 2019-09-07 DIAGNOSIS — Z30016 Encounter for initial prescription of transdermal patch hormonal contraceptive device: Secondary | ICD-10-CM

## 2019-09-07 MED ORDER — SERTRALINE HCL 50 MG PO TABS: 50.0000 mg | ORAL_TABLET | Freq: Every day | ORAL | 3 refills | Status: DC

## 2019-09-07 MED ORDER — NORELGESTROMIN-ETH ESTRADIOL 150-35 MCG/24HR TD PTWK: 1.0000 | MEDICATED_PATCH | TRANSDERMAL | 12 refills | Status: DC

## 2019-09-07 NOTE — Progress Notes (Signed)
Subjective:    Patient ID: Valerie Crane, female    DOB: Feb 14, 1995, 24 y.o.   MRN: [id number]  HPI This is a 24 yo female who presents today to establish care. Has an 57 month old daughter. 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 Currently working at local [employer]. Lives with her boyfriend, her sister and daughter.   Last CPE- last year/ post partum Pap- 05/2018 Tdap- 09/18/2018 Covid- has had first shot but did not go back for second shot.  Is concerned that she is too late Flu- annual Eye-  Over due Dental- regular Exercise- not regular  Has had worsening anxiety and depression. Will have anxiety attacks where she gets angry and frustrated. More down and negative. Her mother was supportive when she first became pregnant with her daughter but more recently has not been supportive. No self care. Sleeping well. Never on medication. Had some counseling as a child.  Recalls intermittent periods of difficulty controlling anger and feeling down for many years  Interested in patch for contraception. No history of migraines, personal or family history of blood clots.  Does not think she could remember to take a pill daily and does not want any implantable contraception.  Is currently using condoms.  Review of Systems Denies headaches, visual changes, chest pain, shortness of breath, abdominal pain, diarrhea/constipation    Objective:   Physical Exam Vitals reviewed.  Constitutional:      General: She is not in acute distress.    Appearance: Normal appearance. She is obese. She is not ill-appearing, toxic-appearing or diaphoretic.  HENT:     Head: Normocephalic and atraumatic.  Eyes:     Conjunctiva/sclera: Conjunctivae normal.  Cardiovascular:     Rate and Rhythm: Normal rate.  Pulmonary:     Effort: Pulmonary effort is normal.  Neurological:     Mental Status: She is alert and oriented to person, place, and time.  Psychiatric:        Mood and Affect:  Mood normal.        Behavior: Behavior normal.        Thought Content: Thought content normal.        Judgment: Judgment normal.       BP 120/70   Pulse 94   Temp 98 F (36.7 C) (Temporal)   Ht 5' 7.5" (1.715 m)   Wt 241 lb (109.3 kg)   LMP 08/18/2019 (Exact Date)   SpO2 97%   Breastfeeding No   BMI 37.19 kg/m  Wt Readings from Last 3 Encounters:  09/07/19 241 lb (109.3 kg)  06/09/19 230 lb (104.3 kg)  03/03/19 200 lb (90.7 kg)   Depression screen PHQ 2/9 09/07/2019  Decreased Interest 2  Down, Depressed, Hopeless 2  PHQ - 2 Score 4  Altered sleeping 1  Tired, decreased energy 3  Change in appetite 2  Feeling bad or failure about yourself  3  Trouble concentrating 1  Moving slowly or fidgety/restless 1  Suicidal thoughts 1  PHQ-9 Score 16  Difficult doing work/chores Very difficult   GAD 7 : Generalized Anxiety Score 09/07/2019  Nervous, Anxious, on Edge 3  Control/stop worrying 3  Worry too much - different things 3  Trouble relaxing 1  Restless 1  Easily annoyed or irritable 3  Afraid - awful might happen 2  Total GAD 7 Score 16  Anxiety Difficulty Very difficult        Assessment & Plan:  1.  Encounter to establish care -Reviewed available records in EMR  2. Encounter for initial prescription of transdermal patch hormonal contraceptive device -Provided written and verbal information regarding use of birth control patch - norelgestromin-ethinyl estradiol (ORTHO EVRA) 150-35 MCG/24HR transdermal patch; Place 1 patch onto the skin once a week.  Dispense: 3 patch; Refill: 12  3. Anxiety and depression -Encouraged her to schedule appointment for counseling and provided her contact information -Provided written and verbal information regarding interventions for anxiety and depression aside from medication and counseling - sertraline (ZOLOFT) 50 MG tablet; Take 1 tablet (50 mg total) by mouth daily.  Dispense: 30 tablet; Refill: 3 -Follow-up in 8 to 12  weeks  4. Class 2 obesity due to excess calories without serious comorbidity with body mass index (BMI) of 37.0 to 37.9 in adult -We will address at follow-up and check labs  This visit occurred during the SARS-CoV-2 public health emergency.  Safety protocols were in place, including screening questions prior to the visit, additional usage of staff PPE, and extensive cleaning of exam room while observing appropriate contact time as indicated for disinfecting solutions.      Olean Ree, FNP-BC  [facility], [facility]  09/08/2019 8:47 AM

## 2019-09-07 NOTE — Patient Instructions (Signed)
For anxiety I have sent in sertraline. Take 1/2 tablet every evening for 3-5 days then increase to 1 tablet.   Follow in 8-12 weeks for complete physical exam.   Medication for depression and anxiety often takes 6-8 weeks to have a noticeable difference so stick with it. Also the best way for recovery is taking medication and seeing a therapist -- this is so important.      How to help anxiety and depression   1) Regular Exercise - walking, jogging, cycling, dancing, strength training - aiming for 150 minutes of exercise a week -- Yoga has been shown in research to reduce depression and anxiety -- with even just one hour long session per week -- Walk leisurely for 30 minutes every day  2)  Begin a Mindfulness/Meditation practice -- this can take a little as 3 minutes and is helpful for all kinds of mood issues -- You can find resources in books -- Or you can download apps like  -- Headspace App  -- Calm  -- Insignt Timer -- Stop, Breathe & Think   # With each of these Apps - you should decline the "start free trial" offer and as you search through the App should be able to access some of their free content. You can also chose to pay for the content if you find one that works well for you.    # Many of them also offer sleep specific content which may help with insomnia   3) Healthy Diet - Avoid fast foods and processed foods, eat mostly lean proteins, vegetables, fruits and whole grains -- Avoid or decrease Caffeine -- Avoid or decrease Alcohol -- Drink plenty of water, have a balanced diet -- Avoid cigarettes and marijuana (as well as other recreational drugs)   4) Consider contacting a professional therapist  [facility] [phone]     Ethinyl Estradiol; Levonorgestrel skin patches What is this medicine? ETHINYL ESTRADIOL; LEVONORGESTREL (ETH in il es tra DYE ole; LEE voh nor jes trel) skin patch is used as a contraceptive (birth control method). This  medicine combines two types of female hormones, an estrogen and a progestin. This patch is used to prevent ovulation and pregnancy. This medicine may be used for other purposes; ask your health care provider or pharmacist if you have questions. COMMON BRAND NAME(S): Tory Emerald What should I tell my health care provider before I take this medicine? They need to know if you have any of these conditions:  abnormal vaginal bleeding  blood vessel disease or blood clots  breast, cervical, endometrial, ovarian, liver, or uterine cancer  diabetes  gallbladder disease  having surgery  heart disease or recent heart attack  high blood pressure  high cholesterol or triglycerides  history of irregular heartbeat or heart valve problems  kidney disease  liver disease  migraine headaches  protein C deficiency  protein S deficiency  recently had a baby, miscarriage, or abortion  stroke  systemic lupus erythematosus (SLE)  tobacco smoker  an unusual or allergic reaction to estrogens, progestins, other medicines, foods, dyes, or preservatives  pregnant or trying to get pregnant  breast-feeding How should I use this medicine? This patch is applied to the skin. Follow the directions on the prescription label. Apply to clean, dry, healthy skin on the buttock, abdomen, upper outer arm or upper torso, in a place where it will not be rubbed by tight clothing. Do not use lotions or other cosmetics on the site where the patch  will go. Press the patch firmly in place for 10 seconds to ensure good contact with the skin. Change the patch every 7 days on the same day of the week for 3 weeks. You will then have a break from the patch for 1 week, after which you will apply a new patch. Do not use your medicine more often than directed. Contact your pediatrician regarding the use of this medicine in children. Special care may be needed. This medicine has been used in female children who have started  having menstrual periods. A patient package insert for the product will be given with each prescription and refill. Read this sheet carefully each time. The sheet may change frequently. Overdosage: If you think you have taken too much of this medicine contact a poison control center or emergency room at once. NOTE: This medicine is only for you. Do not share this medicine with others. What if I miss a dose? You will need to replace your patch once a week as directed. If your patch is lost or falls off, contact your health care professional for advice. You may need to use another form of birth control if your patch has been off for more than 1 day. What may interact with this medicine? Do not take this medicine with the following medications:  dasabuvir; ombitasvir; paritaprevir; ritonavir  ombitasvir; paritaprevir; ritonavir This medicine may also interact with the following medications:  acetaminophen  antibiotics or medicines for infections, especially rifampin, rifabutin, rifapentine, and possibly penicillins or tetracyclines  aprepitant or fosaprepitant  armodafinil  ascorbic acid (vitamin C)  barbiturate medicines, such as phenobarbital or primidone  bosentan  certain antiviral medicines for hepatitis, HIV or AIDS  certain medicines for cancer treatment  certain medicines for seizures like carbamazepine, clobazam, felbamate, lamotrigine, oxcarbazepine, phenytoin, rufinamide, topiramate  certain medicines for treating high cholesterol  cyclosporine  dantrolene  elagolix  flibanserin  grapefruit juice  lesinurad  medicines for diabetes  medicines to treat fungal infections, such as griseofulvin, miconazole, fluconazole, ketoconazole, itraconazole, posaconazole or voriconazole  mifepristone  mitotane  modafinil  morphine  mycophenolate  St. John's wort  tamoxifen  temazepam  theophylline or aminophylline  thyroid  hormones  tizanidine  tranexamic acid  ulipristal  warfarin This list may not describe all possible interactions. Give your health care provider a list of all the medicines, herbs, non-prescription drugs, or dietary supplements you use. Also tell them if you smoke, drink alcohol, or use illegal drugs. Some items may interact with your medicine. What should I watch for while using this medicine? Visit your doctor or health care professional for regular checks on your progress. You will need a regular breast and pelvic exam and Pap smear while on this medicine. If you have any reason to think you are pregnant, stop using this medicine right away and contact your doctor or health care professional. If you are using this medicine for hormone-related problems, it may take several cycles of use to see improvement in your condition. Smoking increases the risk of getting a blood clot or having a stroke while you are using hormonal birth control, especially if you are more than 25 years old. You are strongly advised not to smoke. This medicine can make your body retain fluid, making your fingers, hands, or ankles swell. Your blood pressure can go up. Contact your doctor or health care professional if you feel you are retaining fluid. This medicine can make you more sensitive to the sun. Keep out of the  sun. If you cannot avoid being in the sun, wear protective clothing and use sunscreen. Do not use sun lamps or tanning beds/booths. If you wear contact lenses and notice visual changes, or if the lenses begin to feel uncomfortable, consult your eye care specialist. In some women, tenderness, swelling, or minor bleeding of the gums may occur. Notify your dentist if this happens. Brushing and flossing your teeth regularly may help limit this. See your dentist regularly and inform your dentist of the medicines you are taking. If you are going to have elective surgery or an MRI, you may need to stop using this  medicine before the surgery or MRI. Consult your health care professional for advice. This medicine does not protect you against HIV infection (AIDS) or any other sexually transmitted diseases. What side effects may I notice from receiving this medicine? Side effects that you should report to your doctor or health care professional as soon as possible:  allergic reactions such as skin rash or itching, hives, swelling of the lips, mouth, tongue, or throat  breast tissue changes or discharge  dark patches of skin on your forehead, cheeks, upper lip, and chin  depression  high blood pressure  migraines or severe, sudden headaches  missed menstrual periods  signs and symptoms of a blood clot such as breathing problems; changes in vision; chest pain; severe, sudden headache; pain, swelling, warmth in the leg; trouble speaking; sudden numbness or weakness of the face, arm or leg  skin reactions at the patch site such as blistering, bleeding, itching, rash, or swelling  stomach pain  yellowing of the eyes or skin Side effects that usually do not require medical attention (report these to your doctor or health care professional if they continue or are bothersome):  breast tenderness  irregular vaginal bleeding or spotting, particularly during the first 3 months of use  headache  nausea  painful menstrual periods  skin redness or mild irritation at site where applied  weight gain (slight) This list may not describe all possible side effects. Call your doctor for medical advice about side effects. You may report side effects to FDA at 1-800-FDA-1088. Where should I keep my medicine? Keep out of the reach of children. Store at room temperature between 15 and 30 degrees C (59 and 86 degrees F). Keep the patch in its pouch until time of use. Throw away any unused medicine after the expiration date. Dispose of used patches properly. Since a used patch may still contain active hormones,  fold the patch in half so that it sticks to itself prior to disposal. Throw away in a place where children or pets cannot reach. NOTE: This sheet is a summary. It may not cover all possible information. If you have questions about this medicine, talk to your doctor, pharmacist, or health care provider.  2020 Elsevier/Gold Standard (2018-04-25 12:53:32)

## 2019-09-10 ENCOUNTER — Encounter: Payer: Self-pay | Admitting: Family Medicine

## 2019-09-10 ENCOUNTER — Telehealth: Payer: Self-pay

## 2019-09-10 NOTE — Telephone Encounter (Signed)
Noted. Mychart message also sent to patient.

## 2019-09-10 NOTE — Telephone Encounter (Signed)
Pt called to report that since starting new Rx for Zoloft Monday, she has possible hives on arms that she now notices on buttocks... rash is raise, itchy and burning... pt has not tried any OTC allergy medication or topical...  I spoke to Dr Sharen Hones as Eunice Blase is out of the office... advice to give pt is to get started on OTC allergy medication and stop Zoloft...   Mediation added to allergy list and d/c from current med list

## 2019-09-15 ENCOUNTER — Other Ambulatory Visit: Payer: Self-pay | Admitting: Family Medicine

## 2019-09-15 MED ORDER — BUSPIRONE HCL 7.5 MG PO TABS: 7.5000 mg | ORAL_TABLET | Freq: Three times a day (TID) | ORAL | 3 refills | Status: DC

## 2019-11-02 ENCOUNTER — Other Ambulatory Visit: Payer: Self-pay | Admitting: Family Medicine

## 2019-11-02 DIAGNOSIS — L509 Urticaria, unspecified: Secondary | ICD-10-CM

## 2019-11-06 ENCOUNTER — Other Ambulatory Visit: Payer: Self-pay

## 2019-11-06 ENCOUNTER — Encounter: Payer: Self-pay | Admitting: Family Medicine

## 2019-11-06 ENCOUNTER — Ambulatory Visit (INDEPENDENT_AMBULATORY_CARE_PROVIDER_SITE_OTHER): Payer: BC Managed Care – PPO | Admitting: Family Medicine

## 2019-11-06 VITALS — BP 116/86 | HR 83 | Temp 97.2°F | Ht 67.5 in | Wt 242.5 lb

## 2019-11-06 DIAGNOSIS — L509 Urticaria, unspecified: Secondary | ICD-10-CM | POA: Diagnosis not present

## 2019-11-06 DIAGNOSIS — F32A Depression, unspecified: Secondary | ICD-10-CM | POA: Diagnosis not present

## 2019-11-06 DIAGNOSIS — F419 Anxiety disorder, unspecified: Secondary | ICD-10-CM | POA: Diagnosis not present

## 2019-11-06 MED ORDER — BUSPIRONE HCL 10 MG PO TABS: 10.0000 mg | ORAL_TABLET | Freq: Three times a day (TID) | ORAL | 3 refills | Status: DC

## 2019-11-06 NOTE — Progress Notes (Signed)
° °  Subjective:    Patient ID: Valerie Crane, female    DOB: 1995-06-12, 24 y.o.   MRN: [id number]  HPI Chief Complaint  Patient presents with   Urticaria    hives flare up   discuss medication    buspar and birth control patch    This is a 24 yo female, accompanied by her infant daughter, who presents today for follow up of anxiety. She is currently taking buspirone 7.5 mg tid.  She occasionally misses middle of the day dose.  Overall she feels that her anxiety is significantly improved, she has less irritability and is overall coping better.  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  Hives, currently with rash on left trunk, right upper leg. Taking benadryl before bed. Applying topical otc cortisone cream.  Referral has already been placed for dermatology and is awaiting scheduling.  She cannot identify any triggers.  Not sure if worsened by stress.  At last visit was started on contraception patch.  She feels that this is working well.  Reports good compliance. Review of Systems Per HPI    Objective:   Physical Exam Physical Exam  Vitals reviewed. Constitutional: Oriented to person, place, and time. Appears well-developed and well-nourished.  HENT:  Head: Normocephalic and atraumatic.  Eyes: Conjunctivae are normal.  Neck: Normal range of motion. Neck supple.  Cardiovascular: Normal rate.   Pulmonary/Chest: Effort normal.  Musculoskeletal: Normal range of motion.  Skin: Few scattered erythematous wheals on left arm left lower abdomen. Neurological: Alert and oriented to person, place, and time.  Psychiatric: Normal mood and affect. Behavior is normal. Judgment and thought content normal.      BP 116/86    Pulse 83    Temp (!) 97.2 F (36.2 C) (Temporal)    Ht 5' 7.5" (1.715 m)    Wt 242 lb 8 oz (110 kg)    SpO2 97%    BMI 37.42 kg/m  Wt Readings from Last 3 Encounters:  11/06/19 242 lb 8 oz (110 kg)  09/07/19 241 lb (109.3 kg)  06/09/19 230 lb  (104.3 kg)   GAD 7 : Generalized Anxiety Score 09/07/2019  Nervous, Anxious, on Edge 3  Control/stop worrying 3  Worry too much - different things 3  Trouble relaxing 1  Restless 1  Easily annoyed or irritable 3  Afraid - awful might happen 2  Total GAD 7 Score 16  Anxiety Difficulty Very difficult        Assessment & Plan:  1. Anxiety and depression -Will increase her buspirone from 7.5 mg 3 times daily to 10 mg 3 times daily -Follow-up in 6 months, sooner if needed - busPIRone (BUSPAR) 10 MG tablet; Take 1 tablet (10 mg total) by mouth 3 (three) times daily.  Dispense: 90 tablet; Refill: 3 -Encouraged continued nonpharmacologic interventions such as adequate sleep, regular exercise  2. Hives -Discussed adding daily long-acting antihistamine, continue topical hydrocortisone, dermatology referral in process.  This visit occurred during the SARS-CoV-2 public health emergency.  Safety protocols were in place, including screening questions prior to the visit, additional usage of staff PPE, and extensive cleaning of exam room while observing appropriate contact time as indicated for disinfecting solutions.      Olean Ree, FNP-BC  [facility] at [facility], [facility]  11/08/2019 9:11 AM

## 2019-11-06 NOTE — Patient Instructions (Signed)
Add an over the counter generic Zyrtec (cetirizine) once a day, in morning, if it makes you sleepy, take at night

## 2019-12-01 ENCOUNTER — Other Ambulatory Visit: Payer: Self-pay | Admitting: Family Medicine

## 2019-12-01 DIAGNOSIS — F32A Depression, unspecified: Secondary | ICD-10-CM

## 2019-12-01 DIAGNOSIS — F419 Anxiety disorder, unspecified: Secondary | ICD-10-CM

## 2019-12-20 ENCOUNTER — Encounter: Payer: Self-pay | Admitting: Family Medicine

## 2020-03-07 ENCOUNTER — Telehealth: Payer: Self-pay

## 2020-03-07 NOTE — Telephone Encounter (Signed)
Noted, will discuss at upcoming appt 

## 2020-03-07 NOTE — Telephone Encounter (Signed)
Patient cancelled original appointment on 04/22/2020 and moved it to 03/15/2020 via MyChart. Patient stated in the comment section, "Need a closer date to see doctor, depression is really starting to take a toll on me, I have been having really intrusive thoughts about harming myself I wanted to reschedule so I can be seen sooner." Called and spoke with patient who stated that she has been dealing with anxiety and depression for a while now. Patient currently takes buspirone and it seems to be helping with her anxiety, but not depression. Patient states that she has these moments where she cries uncontrollably, gets angry, and "claws herself." Patient stated that the last time she had thoughts of harming herself was last night and she scratched herself. Patient denies having these thoughts at the moment and states that when she does have these thoughts she does not actively have a plan. Patient stated that she does not see a therapist, but is interested. Patient stated that nothing in particular triggers these thoughts and that they are random. Patient stated, "I know it sounds crazy, but this is how I feel. I would never harm myself with a weapon or anything. I just want to claw my skin." Patient is scheduled with Nicki Reaper, NP tomorrow at 2:30. Informed patient that if she has thoughts of harming herself again, she needs to call 911 and go to the ED. Patient verbalized understanding and stated that she has someone at the house with her.

## 2020-03-08 ENCOUNTER — Encounter: Payer: Self-pay | Admitting: Internal Medicine

## 2020-03-08 ENCOUNTER — Other Ambulatory Visit: Payer: Self-pay

## 2020-03-08 ENCOUNTER — Encounter: Payer: Self-pay | Admitting: Family Medicine

## 2020-03-08 ENCOUNTER — Ambulatory Visit: Payer: Medicaid Other | Admitting: Internal Medicine

## 2020-03-08 VITALS — BP 124/72 | HR 78 | Temp 97.9°F | Wt 254.0 lb

## 2020-03-08 DIAGNOSIS — F32A Depression, unspecified: Secondary | ICD-10-CM | POA: Diagnosis not present

## 2020-03-08 DIAGNOSIS — F419 Anxiety disorder, unspecified: Secondary | ICD-10-CM | POA: Diagnosis not present

## 2020-03-08 MED ORDER — BUPROPION HCL ER (XL) 150 MG PO TB24: 150.0000 mg | ORAL_TABLET | Freq: Every day | ORAL | 1 refills | Status: DC

## 2020-03-08 NOTE — Assessment & Plan Note (Signed)
Deteriorated Support offered today Will trial Wellbutrin 150 mg XL daily in addition to Buspar 1-3 times daily as needed Referral to psychology placed

## 2020-03-08 NOTE — Progress Notes (Signed)
Subjective:    Patient ID: Valerie Crane, female    DOB: 03-28-1995, 25 y.o.   MRN: [id number]  HPI  Pt presents to the clinic today for follow up for anxiety and depression. This has been an ongoing issue since having her child 11/2018. This is currently managed on Buspar which she reports works really well for her anxiety but not her depression. She reports the negative thoughts will build on each other making her to the point where she engages in self destructive behavior: ie: forceful scratching but she denies SI/HI.Marland Kitchen  Review of Systems      Past Medical History:  Diagnosis Date  . Gestational diabetes   . History of COVID-19 03/02/2019  . Medical history non-contributory     Current Outpatient Medications  Medication Sig Dispense Refill  . busPIRone (BUSPAR) 10 MG tablet TAKE 1 TABLET BY MOUTH THREE TIMES A DAY 270 tablet 0  . ibuprofen (ADVIL) 200 MG tablet Take 800 mg by mouth every 8 (eight) hours as needed (for pain/cramps.).    Marland Kitchen naproxen sodium (ALEVE) 220 MG tablet Take 440 mg by mouth 2 (two) times daily as needed (pain). (Patient not taking: Reported on 11/06/2019)    . norelgestromin-ethinyl estradiol (ORTHO EVRA) 150-35 MCG/24HR transdermal patch Place 1 patch onto the skin once a week. 3 patch 12   No current facility-administered medications for this visit.    Allergies  Allergen Reactions  . Zoloft [Sertraline] Hives    Family History  Problem Relation Age of Onset  . Diabetes Mother     Social History   Socioeconomic History  . Marital status: Single    Spouse name: Not on file  . Number of children: Not on file  . Years of education: Not on file  . Highest education level: Not on file  Occupational History  . Not on file  Tobacco Use  . Smoking status: Former Smoker    Packs/day: 0.50    Types: Cigarettes    Quit date: 03/08/2018    Years since quitting: 2.0  . Smokeless tobacco: Never Used  Vaping Use  . Vaping Use: Every day  .  Substances: Nicotine  Substance and Sexual Activity  . Alcohol use: Yes    Comment: socially  . Drug use: Not Currently    Types: Marijuana    Comment: none since 06/05/19  . Sexual activity: Not Currently    Birth control/protection: None  Other Topics Concern  . Not on file  Social History Narrative  . Not on file   Social Determinants of Health   Financial Resource Strain: Not on file  Food Insecurity: Not on file  Transportation Needs: Not on file  Physical Activity: Not on file  Stress: Not on file  Social Connections: Not on file  Intimate Partner Violence: Not on file     Constitutional: Denies fever, malaise, fatigue, headache or abrupt weight changes.  Respiratory: Denies difficulty breathing, shortness of breath, cough or sputum production.   Cardiovascular: Denies chest pain, chest tightness, palpitations or swelling in the hands or feet.  Neurological: Denies dizziness, difficulty with memory, difficulty with speech or problems with balance and coordination.  Psych: Pt reports anxiety and depression. Denies SI/HI.  No other specific complaints in a complete review of systems (except as listed in HPI above).  Objective:   Physical Exam  BP 124/72   Pulse 78   Temp 97.9 F (36.6 C) (Temporal)   Wt 254 lb (115.2 kg)  SpO2 98%   BMI 39.19 kg/m   Wt Readings from Last 3 Encounters:  11/06/19 242 lb 8 oz (110 kg)  09/07/19 241 lb (109.3 kg)  06/09/19 230 lb (104.3 kg)    General: Appears her stated age, obese, in NAD. Cardiovascular: Normal rate. Pulmonary/Chest: Normal effort. Neurological: Alert and oriented.  Psychiatric: Mood and affect mildly flat. Behavior is normal. Judgment and thought content normal.   EKG:  BMET    Component Value Date/Time   NA 139 03/02/2019 0740   NA 137 11/06/2018 1448   K 3.4 (L) 03/02/2019 0740   CL 103 03/02/2019 0740   CO2 20 (L) 03/02/2019 0740   GLUCOSE 132 (H) 03/02/2019 0740   BUN 11 03/02/2019 0740    BUN 10 11/06/2018 1448   CREATININE 0.87 03/02/2019 0740   CALCIUM 9.1 03/02/2019 0740   GFRNONAA >60 03/02/2019 0740   GFRAA >60 03/02/2019 0740    Lipid Panel  No results found for: CHOL, TRIG, HDL, CHOLHDL, VLDL, LDLCALC  CBC    Component Value Date/Time   WBC 10.2 06/09/2019 0850   RBC 4.19 06/09/2019 0850   HGB 12.3 06/09/2019 0850   HGB 11.8 11/06/2018 1448   HCT 38.5 06/09/2019 0850   HCT 34.8 11/06/2018 1448   PLT 325 06/09/2019 0850   PLT 248 11/06/2018 1448   MCV 91.9 06/09/2019 0850   MCV 86 11/06/2018 1448   MCH 29.4 06/09/2019 0850   MCHC 31.9 06/09/2019 0850   RDW 14.1 06/09/2019 0850   RDW 12.9 11/06/2018 1448   LYMPHSABS 2.0 05/08/2018 1559   EOSABS 0.1 05/08/2018 1559   BASOSABS 0.1 05/08/2018 1559    Hgb A1C Lab Results  Component Value Date   HGBA1C 5.5 10/09/2018            Assessment & Plan:    Nicki Reaper, NP This visit occurred during the SARS-CoV-2 public health emergency.  Safety protocols were in place, including screening questions prior to the visit, additional usage of staff PPE, and extensive cleaning of exam room while observing appropriate contact time as indicated for disinfecting solutions.

## 2020-03-08 NOTE — Patient Instructions (Signed)
Major Depressive Disorder, Adult Major depressive disorder is a mental health condition. This disorder affects feelings. It can also affect the body. Symptoms of this condition last most of the day, almost every day, for 2 weeks. This disorder can affect:  Relationships.  Daily activities, such as work and school.  Activities that you normally like to do. What are the causes? The cause of this condition is not known. The disorder is likely caused by a mix of things, including:  Your personality, such as being a shy person.  Your behavior, or how you act toward others.  Your thoughts and feelings.  Too much alcohol or drugs.  How you react to stress.  Health and mental problems that you have had for a long time.  Things that hurt you in the past (trauma).  Big changes in your life, such as divorce. What increases the risk? The following factors may make you more likely to develop this condition:  Having family members with depression.  Being a woman.  Problems in the family.  Low levels of some brain chemicals.  Things that caused you pain as a child, especially if you lost a parent or were abused.  A lot of stress in your life, such as from: ? Living without basic needs of life, such as food and shelter. ? Being treated poorly because of race, sex, or religion (discrimination).  Health and mental problems that you have had for a long time. What are the signs or symptoms? The main symptoms of this condition are:  Being sad all the time.  Being grouchy all the time.  Loss of interest in things and activities. Other symptoms include:  Sleeping too much or too little.  Eating too much or too little.  Gaining or losing weight, without knowing why.  Feeling tired or having low energy.  Being restless and weak.  Feeling hopeless, worthless, or guilty.  Trouble thinking clearly or making decisions.  Thoughts of hurting yourself or others, or thoughts of  ending your life.  Spending a lot of time alone.  Inability to complete common tasks of daily life. If you have very bad MDD, you may:  Believe things that are not true.  Hear, see, taste, or feel things that are not there.  Have mild depression that lasts for at least 2 years.  Feel very sad and hopeless.  Have trouble speaking or moving. How is this treated? This condition may be treated with:  Talk therapy. This teaches you to know bad thoughts, feelings, and actions and how to change them. ? This can also help you to communicate with others. ? This can be done with members of your family.  Medicines. These can be used to treat worry (anxiety), depression, or low levels of chemicals in the brain.  Lifestyle changes. You may need to: ? Limit alcohol use. ? Limit drug use. ? Get regular exercise. ? Get plenty of sleep. ? Make healthy eating choices. ? Spend more time outdoors.  Brain stimulation. This treatment excites the brain. This is done when symptoms are very bad or have not gotten better with other treatments. Follow these instructions at home: Activity  Get regular exercise as told.  Spend time outdoors as told.  Make time to do the things you enjoy.  Find ways to deal with stress. Try to: ? Meditate. ? Do deep breathing. ? Spend time in nature. ? Keep a journal.  Return to your normal activities as told by your doctor.   Ask your doctor what activities are safe for you. Alcohol and drug use  If you drink alcohol: ? Limit how much you use to:  0-1 drink a day for women.  0-2 drinks a day for men. ? Be aware of how much alcohol is in your drink. In the [place] one drink equals one 12 oz bottle of beer (355 mL), one 5 oz glass of wine (148 mL), or one 1 oz glass of hard liquor (44 mL).  Talk to your doctor about: ? Alcohol use. Alcohol can affect some medicines. ? Any drug use. General instructions  Take over-the-counter and prescription medicines  and herbal preparations only as told by your doctor.  Eat a healthy diet.  Get a lot of sleep.  Think about joining a support group. Your doctor may be able to suggest one.  Keep all follow-up visits as told by your doctor. This is important.   Where to find more information:  National Alliance on Mental Illness: www.nami.org  [place] National Institute of Mental Health: www.nimh.nih.gov  American Psychiatric Association: www.psychiatry.org/patients-families/ Contact a doctor if:  Your symptoms get worse.  You get new symptoms. Get help right away if:  You hurt yourself.  You have serious thoughts about hurting yourself or others.  You see, hear, taste, smell, or feel things that are not there. If you ever feel like you may hurt yourself or others, or have thoughts about taking your own life, get help right away. Go to your nearest emergency department or:  Call your local emergency services (911 in the [place]  Call a suicide crisis helpline, such as the National Suicide Prevention Lifeline at 1-800-273-8255. This is open 24 hours a day in the [place]  Text the Crisis Text Line at 741741 (in the [place] Summary  Major depressive disorder is a mental health condition. This disorder affects feelings. Symptoms of this condition last most of the day, almost every day, for 2 weeks.  The symptoms of this disorder can cause problems with relationships and with daily activities.  There are treatments and support for people who get this disorder. You may need more than one type of treatment.  Get help right away if you have serious thoughts about hurting yourself or others. This information is not intended to replace advice given to you by your health care provider. Make sure you discuss any questions you have with your health care provider. Document Revised: 01/03/2019 Document Reviewed: 01/03/2019 Elsevier Patient Education  2021 Elsevier Inc.  

## 2020-03-15 ENCOUNTER — Ambulatory Visit: Payer: BC Managed Care – PPO | Admitting: Internal Medicine

## 2020-03-30 ENCOUNTER — Other Ambulatory Visit: Payer: Self-pay | Admitting: Internal Medicine

## 2020-04-06 ENCOUNTER — Ambulatory Visit: Payer: Medicaid Other | Admitting: Internal Medicine

## 2020-04-06 ENCOUNTER — Other Ambulatory Visit: Payer: Self-pay

## 2020-04-06 ENCOUNTER — Encounter: Payer: Self-pay | Admitting: Internal Medicine

## 2020-04-06 VITALS — BP 124/74 | HR 98 | Temp 97.2°F | Wt 256.0 lb

## 2020-04-06 DIAGNOSIS — F32A Depression, unspecified: Secondary | ICD-10-CM | POA: Diagnosis not present

## 2020-04-06 DIAGNOSIS — Z3009 Encounter for other general counseling and advice on contraception: Secondary | ICD-10-CM | POA: Diagnosis not present

## 2020-04-06 DIAGNOSIS — F419 Anxiety disorder, unspecified: Secondary | ICD-10-CM | POA: Diagnosis not present

## 2020-04-06 NOTE — Progress Notes (Signed)
Subjective:    Patient ID: Valerie Crane, female    DOB: 04-Dec-1995, 25 y.o.   MRN: [id number]  HPI  Pt presents to the clinic today for 1 month follow up for anxiety and depression. At her last visit, she was started on Wellbutrin in addition to her Buspar. She has been taking the medications as prescribed and denies adverse side effects. She reports increased motivation, energy and focus at home and at work. She was also referred for therapy but has not heard anything about this appt.  She would also like to consider coming off the Ortho Evra patch. She has trouble getting this to stick on her skin properly. She would like to try an oral pill.  Review of Systems  Past Medical History:  Diagnosis Date  . Gestational diabetes   . History of COVID-19 03/02/2019  . Medical history non-contributory     Current Outpatient Medications  Medication Sig Dispense Refill  . buPROPion (WELLBUTRIN XL) 150 MG 24 hr tablet TAKE 1 TABLET BY MOUTH EVERY DAY 90 tablet 0  . busPIRone (BUSPAR) 10 MG tablet TAKE 1 TABLET BY MOUTH THREE TIMES A DAY 270 tablet 0  . naproxen sodium (ALEVE) 220 MG tablet Take 440 mg by mouth 2 (two) times daily as needed (pain).    . norelgestromin-ethinyl estradiol (ORTHO EVRA) 150-35 MCG/24HR transdermal patch Place 1 patch onto the skin once a week. 3 patch 12   No current facility-administered medications for this visit.    Allergies  Allergen Reactions  . Zoloft [Sertraline] Hives    Family History  Problem Relation Age of Onset  . Diabetes Mother     Social History   Socioeconomic History  . Marital status: Single    Spouse name: Not on file  . Number of children: Not on file  . Years of education: Not on file  . Highest education level: Not on file  Occupational History  . Not on file  Tobacco Use  . Smoking status: Former Smoker    Packs/day: 0.50    Types: Cigarettes    Quit date: 03/08/2018    Years since quitting: 2.0  . Smokeless  tobacco: Never Used  Vaping Use  . Vaping Use: Every day  . Substances: Nicotine  Substance and Sexual Activity  . Alcohol use: Yes    Comment: socially  . Drug use: Not Currently    Types: Marijuana    Comment: none since 06/05/19  . Sexual activity: Not Currently    Birth control/protection: None  Other Topics Concern  . Not on file  Social History Narrative  . Not on file   Social Determinants of Health   Financial Resource Strain: Not on file  Food Insecurity: Not on file  Transportation Needs: Not on file  Physical Activity: Not on file  Stress: Not on file  Social Connections: Not on file  Intimate Partner Violence: Not on file     Constitutional: Denies fever, malaise, fatigue, headache or abrupt weight changes.  Respiratory: Denies difficulty breathing, shortness of breath, cough or sputum production.   Neurological: Denies dizziness, difficulty with memory, difficulty with speech or problems with balance and coordination.  Psych: Pt has a history of anxiety and depression. Denies SI/HI.  No other specific complaints in a complete review of systems (except as listed in HPI above).     Objective:   Physical Exam   BP 124/74   Pulse 98   Temp (!) 97.2 F (36.2 C) (  Temporal)   Wt 256 lb (116.1 kg)   BMI 39.50 kg/m   Wt Readings from Last 3 Encounters:  03/08/20 254 lb (115.2 kg)  11/06/19 242 lb 8 oz (110 kg)  09/07/19 241 lb (109.3 kg)    General: Appears her stated age, obese, in NAD. Cardiovascular: Normal rate. Pulmonary/Chest: Normal effort. Neurological: Alert and oriented.  Psychiatric: Mood and affect normal. Behavior is normal. Judgment and thought content normal.     BMET    Component Value Date/Time   NA 139 03/02/2019 0740   NA 137 11/06/2018 1448   K 3.4 (L) 03/02/2019 0740   CL 103 03/02/2019 0740   CO2 20 (L) 03/02/2019 0740   GLUCOSE 132 (H) 03/02/2019 0740   BUN 11 03/02/2019 0740   BUN 10 11/06/2018 1448   CREATININE 0.87  03/02/2019 0740   CALCIUM 9.1 03/02/2019 0740   GFRNONAA >60 03/02/2019 0740   GFRAA >60 03/02/2019 0740    Lipid Panel  No results found for: CHOL, TRIG, HDL, CHOLHDL, VLDL, LDLCALC  CBC    Component Value Date/Time   WBC 10.2 06/09/2019 0850   RBC 4.19 06/09/2019 0850   HGB 12.3 06/09/2019 0850   HGB 11.8 11/06/2018 1448   HCT 38.5 06/09/2019 0850   HCT 34.8 11/06/2018 1448   PLT 325 06/09/2019 0850   PLT 248 11/06/2018 1448   MCV 91.9 06/09/2019 0850   MCV 86 11/06/2018 1448   MCH 29.4 06/09/2019 0850   MCHC 31.9 06/09/2019 0850   RDW 14.1 06/09/2019 0850   RDW 12.9 11/06/2018 1448   LYMPHSABS 2.0 05/08/2018 1559   EOSABS 0.1 05/08/2018 1559   BASOSABS 0.1 05/08/2018 1559    Hgb A1C Lab Results  Component Value Date   HGBA1C 5.5 10/09/2018           Assessment & Plan:   Encounter for Contraceptive Counseling:  Discussed that since we just got her mood regulated, would want to hold off adjusting her hormones for at least 2-3 months- she understands and agrees with this plan  Return precautions discussed  Nicki Reaper, NP This visit occurred during the SARS-CoV-2 public health emergency.  Safety protocols were in place, including screening questions prior to the visit, additional usage of staff PPE, and extensive cleaning of exam room while observing appropriate contact time as indicated for disinfecting solutions.

## 2020-04-06 NOTE — Assessment & Plan Note (Signed)
Improved on Wellbutrin and Buspar Support offered Checked into referral- unable to schedule until Aug/Sept d/t Medicaid coverage

## 2020-04-06 NOTE — Patient Instructions (Signed)
https://www.nimh.nih.gov/health/publications/depression-what-you-need-to-know/index.shtml">  Depression Screening Depression screening is a tool that your health care provider can use to learn if you have symptoms of depression. Depression is a common condition with many symptoms that are also often found in other conditions. Depression is treatable, but it must first be diagnosed. You may not know that certain feelings, thoughts, and behaviors that you are having can be symptoms of depression. Taking a depression screening test can help you and your health care provider decide if you need more assessment, or if you should be referred to a mental health care provider. What are the screening tests?  You may have a physical exam to see if another condition is affecting your mental health. You may have a blood or urine sample taken during the physical exam.  You may be interviewed using a screening tool that was developed from research, such as one of these: ? Patient Health Questionnaire (PHQ). This is a set of either 2 or 9 questions. A health care provider who has been trained to score this screening test uses a guide to assess if your symptoms suggest that you may have depression. ? Hamilton Depression Rating Scale (HAM-D). This is a set of either 17 or 24 questions. You may be asked to take it again during or after your treatment, to see if your depression has gotten better. ? Beck Depression Inventory (BDI). This is a set of 21 multiple choice questions. Your health care provider scores your answers to assess:  Your level of depression, ranging from mild to severe.  Your response to treatment.  Your health care provider may talk with you about your daily activities, such as eating, sleeping, work, and recreation, and ask if you have had any changes in activity.  Your health care provider may ask you to see a mental health specialist, such as a psychiatrist or psychologist, for more  evaluation. Who should be screened for depression?  All adults, including adults with a family history of a mental health disorder.  Adolescents who are 12-18 years old.  People who are recovering from a myocardial infarction (MI).  Pregnant women, or women who have given birth.  People who have a long-term (chronic) illness.  Anyone who has been diagnosed with another type of a mental health disorder.  Anyone who has symptoms that could show depression.   What do my results mean? Your health care provider will review the results of your depression screening, physical exam, and lab tests. Positive screens suggest that you may have depression. Screening is the first step in getting the care that you may need. It is up to you to get your screening results. Ask your health care provider, or the department that is doing your screening tests, when your results will be ready. Talk with your health care provider about your results and diagnosis. A diagnosis of depression is made using the Diagnostic and Statistical Manual of Mental Disorders (DSM-V). This is a book that lists the number and type of symptoms that must be present for a health care provider to give a specific diagnosis.  Your health care provider may work with you to treat your symptoms of depression, or your health care provider may help you find a mental health provider who can assess, diagnose, and treat your depression. Get help right away if:  You have thoughts about hurting yourself or others. If you ever feel like you may hurt yourself or others, or have thoughts about taking your own life, get help   right away. You can go to your nearest emergency department or call:  Your local emergency services (911 in the [place]  A suicide crisis helpline, such as the National Suicide Prevention Lifeline at 1-800-273-8255. This is open 24 hours a day. Summary  Depression screening is the first step in getting the help that you may  need.  If your screening test shows symptoms of depression (is positive), your health care provider may ask you to see a mental health provider.  Anyone who is age 12 or older should be screened for depression. This information is not intended to replace advice given to you by your health care provider. Make sure you discuss any questions you have with your health care provider. Document Revised: 07/16/2019 Document Reviewed: 07/16/2019 Elsevier Patient Education  2021 Elsevier Inc.  

## 2020-04-07 ENCOUNTER — Ambulatory Visit (HOSPITAL_COMMUNITY)
Admission: EM | Admit: 2020-04-07 | Discharge: 2020-04-07 | Disposition: A | Discharge status: Home / Self Care | Payer: Medicaid Other | Attending: Family Medicine | Admitting: Family Medicine

## 2020-04-07 ENCOUNTER — Encounter (HOSPITAL_COMMUNITY): Payer: Self-pay | Admitting: Emergency Medicine

## 2020-04-07 ENCOUNTER — Ambulatory Visit (INDEPENDENT_AMBULATORY_CARE_PROVIDER_SITE_OTHER): Payer: Medicaid Other

## 2020-04-07 ENCOUNTER — Other Ambulatory Visit: Payer: Self-pay

## 2020-04-07 DIAGNOSIS — W19XXXA Unspecified fall, initial encounter: Secondary | ICD-10-CM | POA: Diagnosis not present

## 2020-04-07 DIAGNOSIS — S92154A Nondisplaced avulsion fracture (chip fracture) of right talus, initial encounter for closed fracture: Secondary | ICD-10-CM | POA: Diagnosis not present

## 2020-04-07 DIAGNOSIS — M25571 Pain in right ankle and joints of right foot: Secondary | ICD-10-CM | POA: Diagnosis not present

## 2020-04-07 DIAGNOSIS — M7989 Other specified soft tissue disorders: Secondary | ICD-10-CM | POA: Diagnosis not present

## 2020-04-07 NOTE — ED Notes (Signed)
Ortho team here applying splint.

## 2020-04-07 NOTE — Discharge Instructions (Signed)
If not allergic, you may use over the counter ibuprofen or acetaminophen as needed. ° °

## 2020-04-07 NOTE — Progress Notes (Signed)
Orthopedic Tech Progress Note Patient Details:  Valerie Crane 06-Nov-1995 [id number]  Ortho Devices Type of Ortho Device: Post (short leg) splint,Crutches Splint Material: Fiberglass Ortho Device/Splint Location: Right Lower Extremity Ortho Device/Splint Interventions: Ordered,Application,Adjustment   Post Interventions Patient Tolerated: Well Instructions Provided: Adjustment of device,Care of device,Poper ambulation with device   Gerald Stabs 04/07/2020, 7:44 PM

## 2020-04-07 NOTE — ED Triage Notes (Signed)
Pt states that she fell down her steps landed on the right side and heard a pop come from her right ankle. Pt ankle has some swelling and slight discoloration.

## 2020-04-07 NOTE — ED Triage Notes (Signed)
Ortho called for short leg split.

## 2020-04-07 NOTE — ED Provider Notes (Signed)
[facility]   [id number] 04/07/20 Arrival Time: 1628  ASSESSMENT & PLAN:  1. Closed nondisplaced avulsion fracture of right talus, initial encounter   2. Acute right ankle pain     I have personally viewed the imaging studies ordered this visit. Apparent small avulsion of talus.  Posterior short leg splint applied by orthopaedic tech. Work note provided.  Orders Placed This Encounter  Procedures  . DG Ankle Complete Right  . Crutches  . Apply splint short leg  . Crutches    Recommend:  Follow-up Information    Schedule an appointment as soon as possible for a visit  with Ortho, [facility].   Specialty: Specialist Contact information: [address] [place] [address] [phone]                Reviewed expectations re: course of current medical issues. Questions answered. Outlined signs and symptoms indicating need for more acute intervention. Patient verbalized understanding. After Visit Summary given.  SUBJECTIVE: History from: patient. Valerie Crane is a 25 y.o. female who reports R ankle pain; inversion injury on stairs today; immed pain. Able to bear wt but with pain. Swelling now noted. No extremity sensation changes or weakness. No OTC analgesic taken.   Past Surgical History:  Procedure Laterality Date  . CESAREAN SECTION N/A 11/13/2018   Procedure: CESAREAN SECTION;  Surgeon: Reva Bores, MD;  Location: [facility];  Service: Obstetrics;  Laterality: N/A;  . LAPAROSCOPIC APPENDECTOMY N/A 06/09/2019   Procedure: LAPAROSCOPIC APPENDECTOMY;  Surgeon: Violeta Gelinas, MD;  Location: [facility] OR;  Service: General;  Laterality: N/A;      OBJECTIVE:  Vitals:   04/07/20 1648  BP: 126/68  Pulse: 95  Temp: (!) 97.5 F (36.4 C)  TempSrc: Temporal  SpO2: 100%    General appearance: alert; no distress HEENT: [place]; AT Neck: supple with FROM Resp: unlabored respirations Extremities: . RLE: warm with well perfused appearance;  poorly localized moderate tenderness over right ankle; anterior and lateral; without gross deformities; swelling: moderate; bruising: minimal over lateral ankle; ankle ROM: limited by reported pain CV: brisk extremity capillary refill of RLE; 2+ DP pulse of RLE. Skin: warm and dry; no visible rashes Neurologic: normal sensation and strength of RLE Psychological: alert and cooperative; normal mood and affect  Imaging: DG Ankle Complete Right  Result Date: 04/07/2020 CLINICAL DATA:  Larey Seat downstairs, swelling, discoloration EXAM: RIGHT ANKLE - COMPLETE 3+ VIEW COMPARISON:  None. FINDINGS: Frontal, oblique, and lateral views of the right ankle are obtained. There is prominent anterior and lateral soft tissue swelling. A small ossific density is seen along the lateral margin of the talus, best visualized on the frontal view. Findings are consistent with a small avulsion fracture, though the exact donor site is not well visualized. Joint spaces are well preserved. IMPRESSION: 1. Small ossific density lateral to the talus on the frontal view, consistent with a small avulsion fracture. Exact donor site is not well visualized. 2. Prominent anterolateral soft tissue swelling. Electronically Signed   By: Sharlet Salina M.D.   On: 04/07/2020 17:31      Allergies  Allergen Reactions  . Zoloft [Sertraline] Hives    Past Medical History:  Diagnosis Date  . Gestational diabetes   . History of COVID-19 03/02/2019  . Medical history non-contributory    Social History   Socioeconomic History  . Marital status: Single    Spouse name: Not on file  . Number of children: Not on file  .  Years of education: Not on file  . Highest education level: Not on file  Occupational History  . Not on file  Tobacco Use  . Smoking status: Former Smoker    Packs/day: 0.50    Types: Cigarettes    Quit date: 03/08/2018    Years since quitting: 2.0  . Smokeless tobacco: Never Used  Vaping Use  . Vaping Use: Every day   . Substances: Nicotine  Substance and Sexual Activity  . Alcohol use: Yes    Comment: socially  . Drug use: Not Currently    Types: Marijuana    Comment: none since 06/05/19  . Sexual activity: Not Currently    Birth control/protection: None  Other Topics Concern  . Not on file  Social History Narrative  . Not on file   Social Determinants of Health   Financial Resource Strain: Not on file  Food Insecurity: Not on file  Transportation Needs: Not on file  Physical Activity: Not on file  Stress: Not on file  Social Connections: Not on file   Family History  Problem Relation Age of Onset  . Diabetes Mother    Past Surgical History:  Procedure Laterality Date  . CESAREAN SECTION N/A 11/13/2018   Procedure: CESAREAN SECTION;  Surgeon: Reva Bores, MD;  Location: [facility];  Service: Obstetrics;  Laterality: N/A;  . LAPAROSCOPIC APPENDECTOMY N/A 06/09/2019   Procedure: LAPAROSCOPIC APPENDECTOMY;  Surgeon: Violeta Gelinas, MD;  Location: [facility] OR;  Service: General;  Laterality: Vertis Kelch, MD 04/07/20 1753

## 2020-04-10 ENCOUNTER — Encounter: Payer: Self-pay | Admitting: Internal Medicine

## 2020-04-12 NOTE — Telephone Encounter (Signed)
She needs to schedule a 30 min UC follow up to discuss

## 2020-04-19 ENCOUNTER — Other Ambulatory Visit: Payer: Self-pay

## 2020-04-19 ENCOUNTER — Encounter: Payer: Self-pay | Admitting: Internal Medicine

## 2020-04-19 ENCOUNTER — Ambulatory Visit: Payer: Medicaid Other | Admitting: Internal Medicine

## 2020-04-19 VITALS — BP 122/72 | HR 71 | Temp 97.9°F | Wt 257.0 lb

## 2020-04-19 DIAGNOSIS — S92154D Nondisplaced avulsion fracture (chip fracture) of right talus, subsequent encounter for fracture with routine healing: Secondary | ICD-10-CM | POA: Diagnosis not present

## 2020-04-19 NOTE — Progress Notes (Signed)
Subjective:    Patient ID: Valerie Crane, female    DOB: 1995/12/17, 25 y.o.   MRN: [id number]  HPI  Patient presents to the clinic today for ER follow-up.  She went to the ER 3/3 with complaint of right ankle pain status post tripping on the stairs.  X-ray showed an avulsion fracture of the talus.  She was placed in a splint, given crutches and referred to orthopedics for further evaluation.  She was discharged and advised to follow-up with her PCP and orthopedics.  Since discharge, she reports she took her splint off. She has been doing ankle exercises. She has sharp stabbing piercing pain that occurs mostly at night. She is having less pain with weight bearing, not using the crutches as much.  She reports she has an appt with Delbert Harness on Thursday. She is taking Ibuprofen as needed with some relief of symptoms.  Review of Systems  Past Medical History:  Diagnosis Date  . Gestational diabetes   . History of COVID-19 03/02/2019  . Medical history non-contributory     Current Outpatient Medications  Medication Sig Dispense Refill  . buPROPion (WELLBUTRIN XL) 150 MG 24 hr tablet TAKE 1 TABLET BY MOUTH EVERY DAY 90 tablet 0  . busPIRone (BUSPAR) 10 MG tablet TAKE 1 TABLET BY MOUTH THREE TIMES A DAY 270 tablet 0  . naproxen sodium (ALEVE) 220 MG tablet Take 440 mg by mouth 2 (two) times daily as needed (pain).    . norelgestromin-ethinyl estradiol (ORTHO EVRA) 150-35 MCG/24HR transdermal patch Place 1 patch onto the skin once a week. 3 patch 12   No current facility-administered medications for this visit.    Allergies  Allergen Reactions  . Zoloft [Sertraline] Hives    Family History  Problem Relation Age of Onset  . Diabetes Mother     Social History   Socioeconomic History  . Marital status: Single    Spouse name: Not on file  . Number of children: Not on file  . Years of education: Not on file  . Highest education level: Not on file  Occupational History  . Not  on file  Tobacco Use  . Smoking status: Former Smoker    Packs/day: 0.50    Types: Cigarettes    Quit date: 03/08/2018    Years since quitting: 2.1  . Smokeless tobacco: Never Used  Vaping Use  . Vaping Use: Every day  . Substances: Nicotine  Substance and Sexual Activity  . Alcohol use: Yes    Comment: socially  . Drug use: Not Currently    Types: Marijuana    Comment: none since 06/05/19  . Sexual activity: Not Currently    Birth control/protection: None  Other Topics Concern  . Not on file  Social History Narrative  . Not on file   Social Determinants of Health   Financial Resource Strain: Not on file  Food Insecurity: Not on file  Transportation Needs: Not on file  Physical Activity: Not on file  Stress: Not on file  Social Connections: Not on file  Intimate Partner Violence: Not on file     Constitutional: Denies fever, malaise, fatigue, headache or abrupt weight changes.  Respiratory: Denies difficulty breathing, shortness of breath, cough or sputum production.   Cardiovascular: Denies chest pain, chest tightness, palpitations or swelling in the hands or feet.  Musculoskeletal: Pt reports right ankle pain. Denies decrease in range of motion, difficulty with gait, muscle pain or joint swelling.  Skin: Denies redness,  rashes, lesions or ulcercations.    No other specific complaints in a complete review of systems (except as listed in HPI above).     Objective:   Physical Exam  BP 122/72   Pulse 71   Temp 97.9 F (36.6 C) (Temporal)   Wt 257 lb (116.6 kg)   LMP 03/25/2020   SpO2 98%   BMI 39.66 kg/m   Wt Readings from Last 3 Encounters:  04/06/20 256 lb (116.1 kg)  03/08/20 254 lb (115.2 kg)  11/06/19 242 lb 8 oz (110 kg)    General: Appears her stated age, obese, in NAD. Skin: Warm, dry and intact.  Cardiovascular: Normal rate. Pulmonary/Chest: Normal effort. Musculoskeletal: Normal flexion, extension and rotation of the right ankle. Limping gait  without crutches. Neurological: Alert and oriented.   BMET    Component Value Date/Time   NA 139 03/02/2019 0740   NA 137 11/06/2018 1448   K 3.4 (L) 03/02/2019 0740   CL 103 03/02/2019 0740   CO2 20 (L) 03/02/2019 0740   GLUCOSE 132 (H) 03/02/2019 0740   BUN 11 03/02/2019 0740   BUN 10 11/06/2018 1448   CREATININE 0.87 03/02/2019 0740   CALCIUM 9.1 03/02/2019 0740   GFRNONAA >60 03/02/2019 0740   GFRAA >60 03/02/2019 0740    Lipid Panel  No results found for: CHOL, TRIG, HDL, CHOLHDL, VLDL, LDLCALC  CBC    Component Value Date/Time   WBC 10.2 06/09/2019 0850   RBC 4.19 06/09/2019 0850   HGB 12.3 06/09/2019 0850   HGB 11.8 11/06/2018 1448   HCT 38.5 06/09/2019 0850   HCT 34.8 11/06/2018 1448   PLT 325 06/09/2019 0850   PLT 248 11/06/2018 1448   MCV 91.9 06/09/2019 0850   MCV 86 11/06/2018 1448   MCH 29.4 06/09/2019 0850   MCHC 31.9 06/09/2019 0850   RDW 14.1 06/09/2019 0850   RDW 12.9 11/06/2018 1448   LYMPHSABS 2.0 05/08/2018 1559   EOSABS 0.1 05/08/2018 1559   BASOSABS 0.1 05/08/2018 1559    Hgb A1C Lab Results  Component Value Date   HGBA1C 5.5 10/09/2018           Assessment & Plan:    ER followup for Avulsion Fracture of Talus, Right:  ER notes and imaging reviewed She has removed her splint, placed her self in an ankle wrap, not using crutches Encouraged rest, ice and elevation Can take Ibuprofen 800 mg every 8 hours as needed She will follow-up with Ortho as planned  Return precautions discussed Nicki Reaper, NP This visit occurred during the SARS-CoV-2 public health emergency.  Safety protocols were in place, including screening questions prior to the visit, additional usage of staff PPE, and extensive cleaning of exam room while observing appropriate contact time as indicated for disinfecting solutions.

## 2020-04-19 NOTE — Patient Instructions (Signed)
RICE Therapy for Routine Care of Injuries Many injuries can be cared for with rest, ice, compression, and elevation (RICE therapy). This includes:  Resting the injured body part.  Putting ice on the injury.  Putting pressure (compression) on the injury.  Raising the injured part (elevation). Using RICE therapy can help to lessen pain and swelling. Supplies needed:  Ice.  Plastic bag.  Towel.  Elastic bandage.  Pillow or pillows to raise your injured body part. How to care for your injury with RICE therapy Rest Try to rest the injured part of your body. You can go back to your normal activities when your doctor says it is okay to do them and when you can do them without pain. If you rest the injury too much, it may not heal as well. Some injuries heal better with early movement instead of resting for too long. Ask your doctor if you should do exercises to help your injury get better. Ice  If told, put ice on the injured area. To do this: ? Put ice in a plastic bag. ? Place a towel between your skin and the bag. ? Leave the ice on for 20 minutes, 2-3 times a day. ? Take off the ice if your skin turns bright red. This is very important. If you cannot feel pain, heat, or cold, you have a greater risk of damage to the area.  Do not put ice on your bare skin. Use ice for as many days as your doctor tells you to use it.   Compression Put pressure on the injured area. This can be done with an elastic bandage. If this type of bandage has been put on your injury:  Follow instructions on the package the bandage came in about how to use it.  Do not wrap the bandage too tightly. ? Wrap the bandage more loosely if part of your body beyond the bandage is blue, swollen, cold, painful, or loses feeling.  Take off the bandage and put it on again every 3-4 hours or as told by your doctor.  See your doctor if the bandage seems to make your problems worse.   Elevation Raise the injured area  above the level of your heart while you are sitting or lying down. Follow these instructions at home:  If your symptoms get worse or last a long time, make a follow-up appointment with your doctor. You may need to have imaging tests, such as X-rays or an MRI.  If you have imaging tests, ask how to get your results when they are ready.  Return to your normal activities when your doctor says that it is safe.  Keep all follow-up visits. Contact a doctor if:  You keep having pain and swelling.  Your symptoms get worse. Get help right away if:  You have sudden, very bad pain at your injury or lower than your injury.  You have redness or more swelling around your injury.  You have tingling or numbness at your injury or lower than your injury, and it does not go away when you take off the bandage. Summary  Many injuries can be cared for using rest, ice, compression, and elevation (RICE therapy).  You can go back to your normal activities when your doctor says it is okay and when you can do them without pain.  Put ice on the injured area as told by your doctor.  Get help if your symptoms get worse or if you keep having pain and swelling.   This information is not intended to replace advice given to you by your health care provider. Make sure you discuss any questions you have with your health care provider. Document Revised: 11/12/2019 Document Reviewed: 11/12/2019 Elsevier Patient Education  2021 [employer] Inc.  

## 2020-04-21 DIAGNOSIS — S93491A Sprain of other ligament of right ankle, initial encounter: Secondary | ICD-10-CM | POA: Diagnosis not present

## 2020-04-22 ENCOUNTER — Ambulatory Visit: Payer: BC Managed Care – PPO | Admitting: Internal Medicine

## 2020-04-24 IMAGING — CT CT ABD-PELV W/ CM
2 of 4 series · 15 of 46 positions shown, 17 images · IV contrast (Omni 300)
Comparison: None.

CLINICAL DATA: Abdominal pain

EXAM:
CT ABDOMEN AND PELVIS WITH CONTRAST
TECHNIQUE: Multidetector CT imaging of the abdomen and pelvis was performed
using the standard protocol following bolus administration of
intravenous contrast.
CONTRAST:  100mL OMNIPAQUE IOHEXOL 300 MG/ML  SOLN

[Series 3: a/p w/ 5mm · axial · 0.84mm/px · z∈[+738,+1228]mm · 12 of 108 slices shown, 14 images]
[im 5/108  soft-tissue]
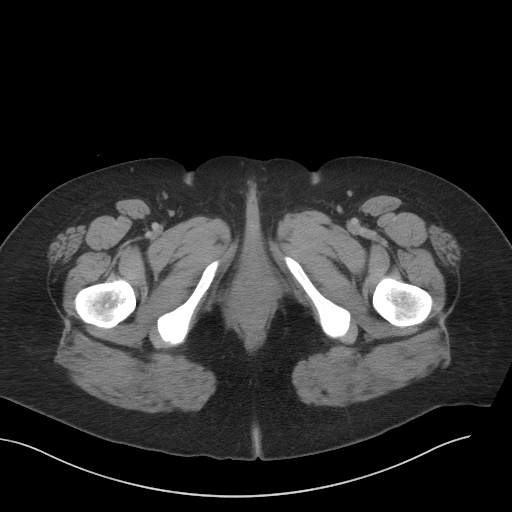
[im 5/108  bone]
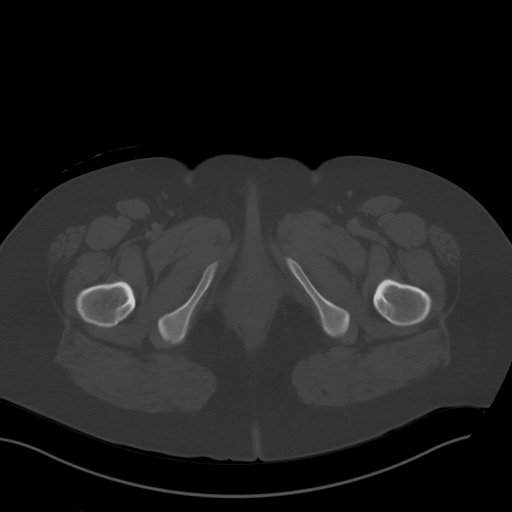
[im 14/108  soft-tissue]
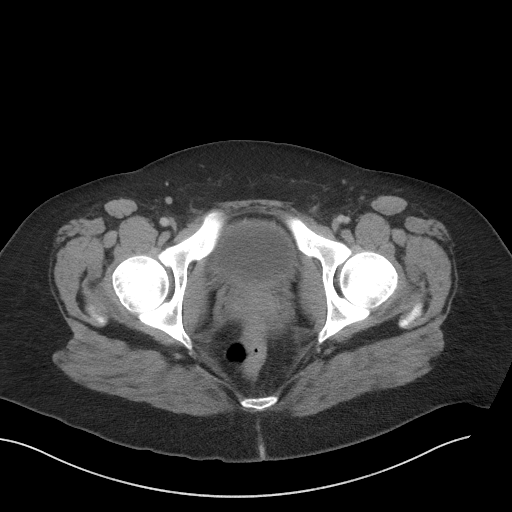
[im 23/108  soft-tissue]
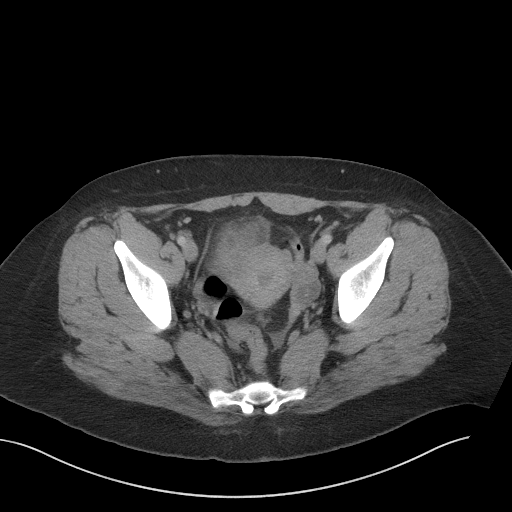
[im 32/108  soft-tissue]
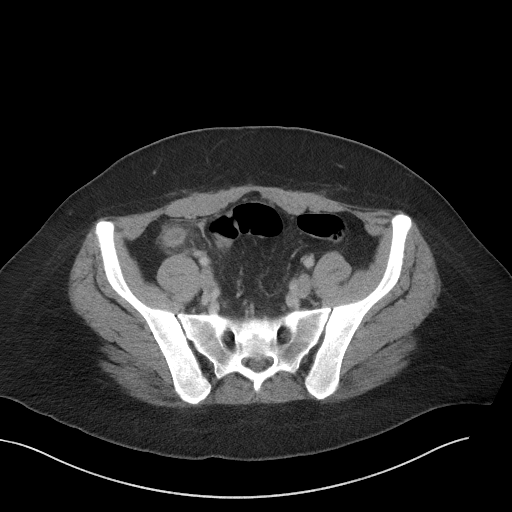
[im 41/108  soft-tissue]
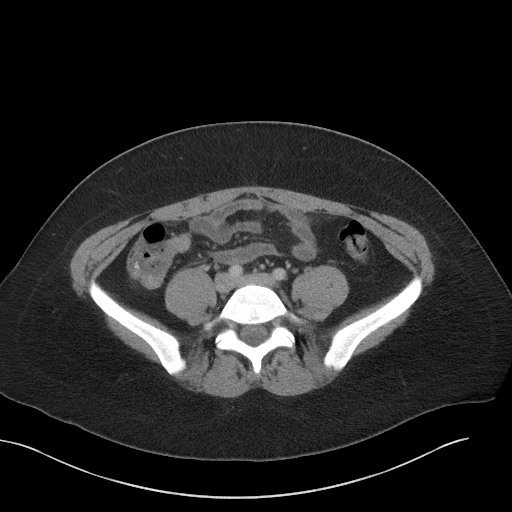
[im 50/108  soft-tissue]
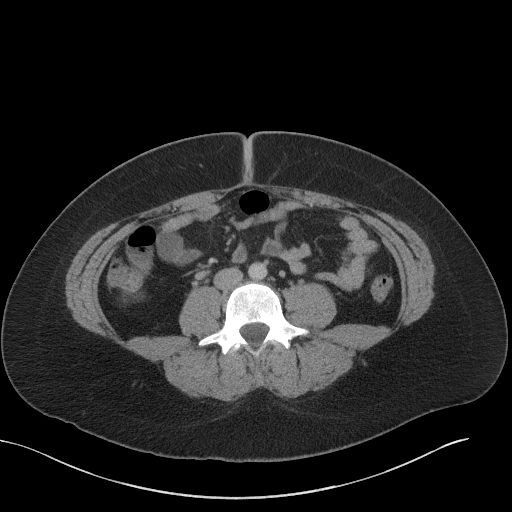
[im 58/108  soft-tissue]
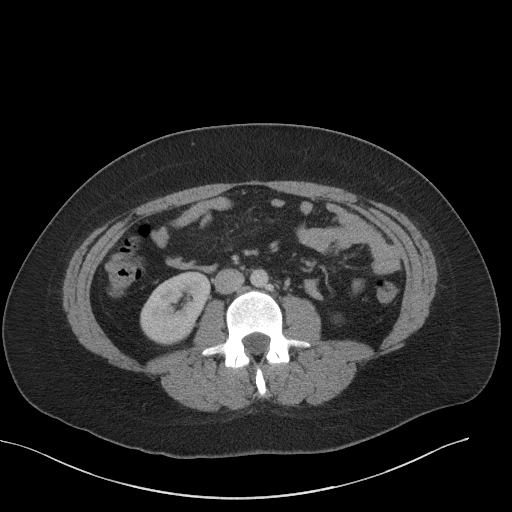
[im 67/108  soft-tissue]
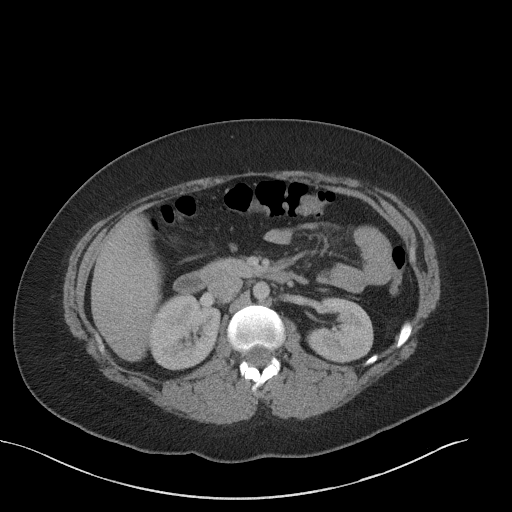
[im 76/108  soft-tissue]
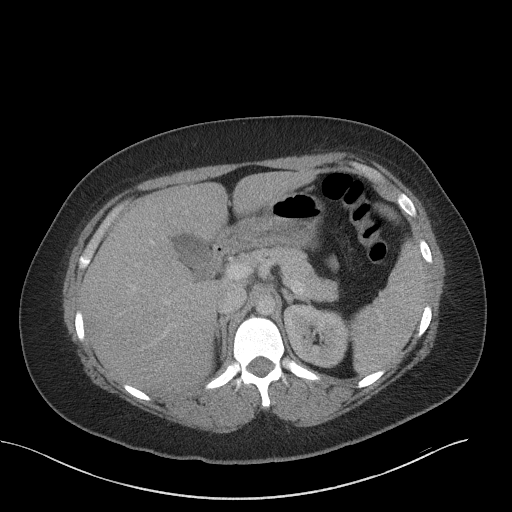
[im 76/108  bone]
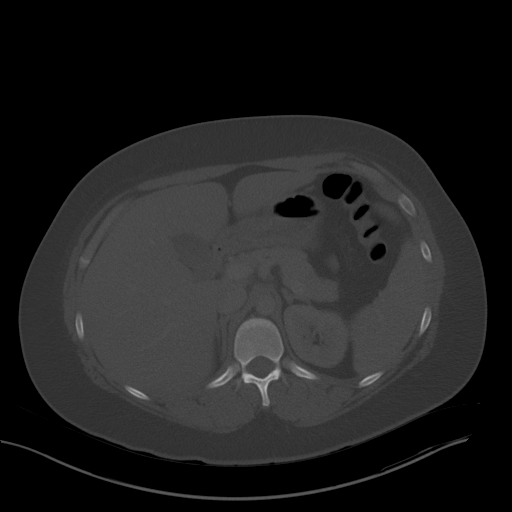
[im 85/108  soft-tissue]
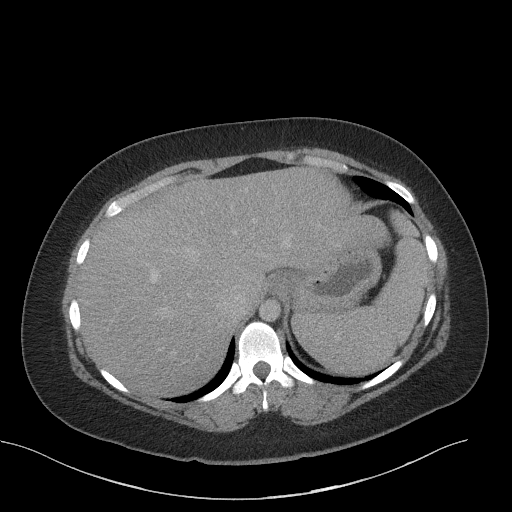
[im 94/108  soft-tissue]
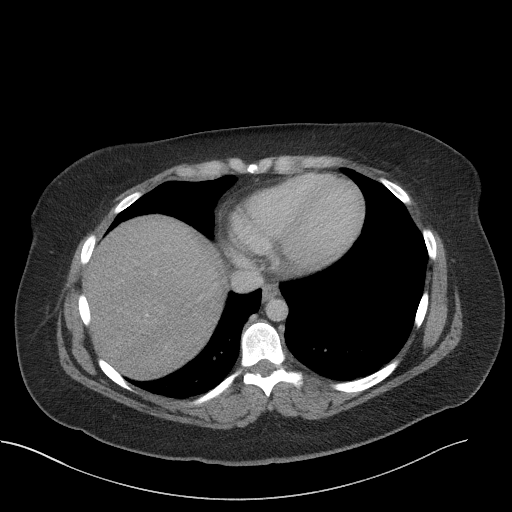
[im 103/108  soft-tissue]
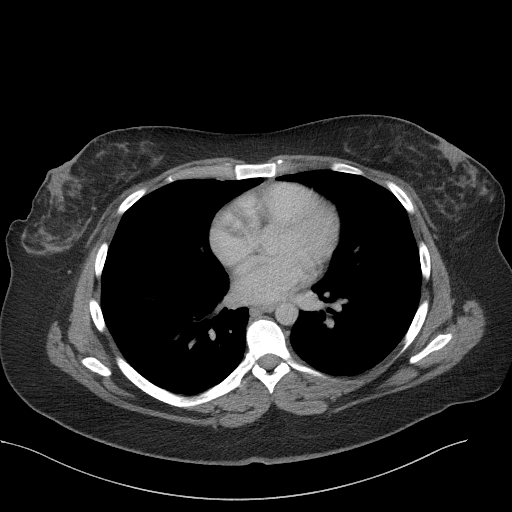

[Series 6: a/p w/ cor · coronal · 0.82mm/px · 3 of 151 slices shown]
[im 51/151  soft-tissue]
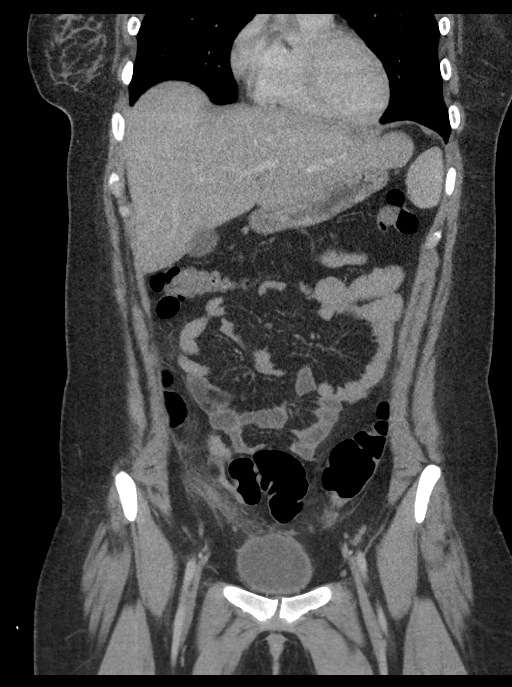
[im 67/151  soft-tissue]
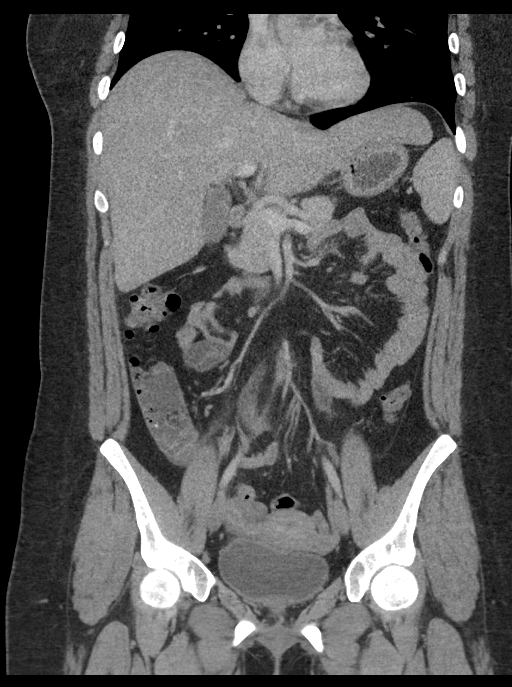
[im 84/151  soft-tissue]
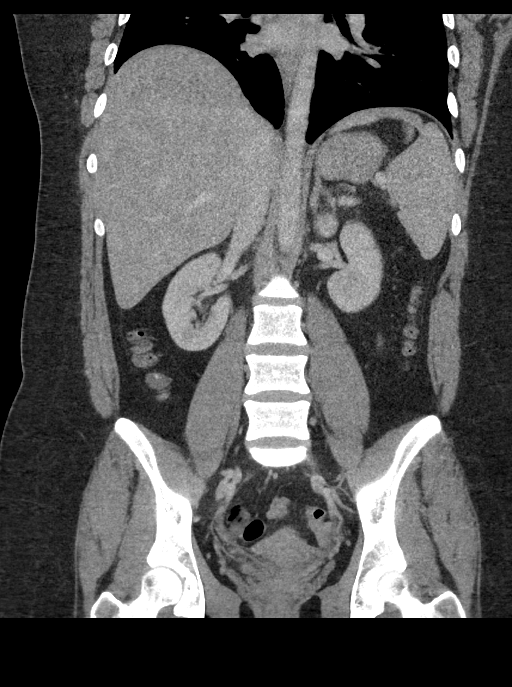

[15 of 46 positions shown; findings below may reference images not displayed]

FINDINGS: Lower chest: There is bibasilar atelectasis.

Hepatobiliary: No focal liver lesions are appreciable. Gallbladder
wall is not appreciably thickened. There is no biliary duct
dilatation.

Pancreas: There is no pancreatic mass or inflammatory focus.

Spleen: Spleen measures 14.8 x 13.2 x 7.7 cm with a measured splenic
volume of 752 cubic cm. No focal splenic lesions are evident.

Adrenals/Urinary Tract: Adrenals bilaterally appear normal. Kidneys
bilaterally show no evident mass or hydronephrosis on either side.
There is no evident renal or ureteral calculus on either side.
Urinary bladder is midline with wall thickness within normal limits.

Stomach/Bowel: There is no appreciable bowel wall or mesenteric
thickening. There are scattered colonic diverticula without
diverticulitis. The terminal ileum appears normal. No evident bowel
obstruction. There is no free air or portal venous air.

Vascular/Lymphatic: There is no abdominal aortic aneurysm. No
vascular lesions are evident. Major venous structures are patent.
There is no adenopathy in the abdomen or pelvis.

Reproductive: Uterus is anteverted. There is no pelvic mass evident.
There is free fluid in the cul-de-sac.

Other: The appendix is distended, measuring 1.4 cm in diameter.
There is soft tissue stranding along the course of the appendix. The
wall of the appendix is mildly enhancing diffusely. There is no
periappendiceal region abscess or perforation.

No abscess evident in the abdomen or pelvis.

Musculoskeletal: No blastic or lytic bone lesions are evident. There
is no intramuscular or abdominal wall lesion.
IMPRESSION: 1.  Evidence of acute appendicitis.

Appendix: Location: Appendix arises from the cecum inferomedially,
tracking transversely in the right mid pelvis with the tip slightly
to the right of midline in mid pelvic region.

Diameter: 1.4 cm

Appendicolith: None

Mucosal hyper-enhancement: Present

Extraluminal gas: None

Periappendiceal collection: None. There is soft tissue stranding
along the course of the appendix.

2. Free fluid in the cul-de-sac. This fluid may be of reactive
etiology given the appendicitis or could represent recent ovarian
cyst rupture.

3. Scattered colonic diverticula without diverticulitis. No bowel
obstruction. No abscess in the abdomen or pelvis.

4.  Splenomegaly of uncertain etiology.  No splenic lesions evident.

5. No renal or ureteral calculus. No hydronephrosis. Urinary bladder
wall thickness normal.

Critical Value/emergent results were called by telephone at the time
of interpretation on 03/02/2019 at [DATE] to Qarib Regala,
PA, who verbally acknowledged these results.

## 2020-05-06 ENCOUNTER — Ambulatory Visit: Payer: BC Managed Care – PPO | Admitting: Family Medicine

## 2020-05-12 ENCOUNTER — Other Ambulatory Visit: Payer: Self-pay | Admitting: Internal Medicine

## 2020-05-16 ENCOUNTER — Encounter: Payer: Self-pay | Admitting: Internal Medicine

## 2020-06-09 ENCOUNTER — Other Ambulatory Visit: Payer: Self-pay

## 2020-06-09 DIAGNOSIS — F419 Anxiety disorder, unspecified: Secondary | ICD-10-CM

## 2020-06-09 DIAGNOSIS — F32A Depression, unspecified: Secondary | ICD-10-CM

## 2020-06-09 MED ORDER — BUSPIRONE HCL 10 MG PO TABS: 10.0000 mg | ORAL_TABLET | Freq: Three times a day (TID) | ORAL | 0 refills | Status: DC

## 2020-06-09 NOTE — Telephone Encounter (Signed)
Pharmacy requests refill on: Buspirone 10 mg   LAST REFILL: 12/01/2019 (Q-270, R-0) LAST OV: 04/19/2020 NEXT OV: Not Scheduled  PHARMACY: [id number] [place], [place]

## 2020-08-22 ENCOUNTER — Other Ambulatory Visit: Payer: Self-pay | Admitting: *Deleted

## 2020-08-22 DIAGNOSIS — Z30016 Encounter for initial prescription of transdermal patch hormonal contraceptive device: Secondary | ICD-10-CM

## 2020-08-22 MED ORDER — NORELGESTROMIN-ETH ESTRADIOL 150-35 MCG/24HR TD PTWK: 1.0000 | MEDICATED_PATCH | TRANSDERMAL | 0 refills | Status: DC

## 2020-08-22 NOTE — Telephone Encounter (Signed)
Last office visit 04/19/2020 with Pamala Hurry for hospital follow up.   Last refilled 09/17/2019 for 3 with 12 refills by D. Leone Payor.  No TOC appointments.

## 2021-05-09 DIAGNOSIS — S63501A Unspecified sprain of right wrist, initial encounter: Secondary | ICD-10-CM | POA: Diagnosis not present

## 2021-05-16 DIAGNOSIS — S63501A Unspecified sprain of right wrist, initial encounter: Secondary | ICD-10-CM | POA: Diagnosis not present

## 2021-05-22 ENCOUNTER — Ambulatory Visit: Payer: Medicaid Other | Admitting: Internal Medicine

## 2021-05-22 ENCOUNTER — Encounter: Payer: Self-pay | Admitting: Internal Medicine

## 2021-05-22 ENCOUNTER — Other Ambulatory Visit (HOSPITAL_COMMUNITY)
Admission: RE | Admit: 2021-05-22 | Discharge: 2021-05-22 | Disposition: A | Discharge status: Home / Self Care | Payer: Medicaid Other | Source: Ambulatory Visit | Attending: Internal Medicine | Admitting: Internal Medicine

## 2021-05-22 VITALS — BP 108/76 | HR 84 | Temp 96.6°F | Ht 68.0 in | Wt 262.0 lb

## 2021-05-22 DIAGNOSIS — Z124 Encounter for screening for malignant neoplasm of cervix: Secondary | ICD-10-CM | POA: Diagnosis not present

## 2021-05-22 DIAGNOSIS — F419 Anxiety disorder, unspecified: Secondary | ICD-10-CM

## 2021-05-22 DIAGNOSIS — F32A Depression, unspecified: Secondary | ICD-10-CM

## 2021-05-22 DIAGNOSIS — Z1159 Encounter for screening for other viral diseases: Secondary | ICD-10-CM | POA: Diagnosis not present

## 2021-05-22 DIAGNOSIS — Z0001 Encounter for general adult medical examination with abnormal findings: Secondary | ICD-10-CM | POA: Diagnosis not present

## 2021-05-22 MED ORDER — BUPROPION HCL ER (XL) 150 MG PO TB24: 150.0000 mg | ORAL_TABLET | Freq: Every day | ORAL | 1 refills | Status: DC

## 2021-05-22 MED ORDER — BUSPIRONE HCL 10 MG PO TABS: 10.0000 mg | ORAL_TABLET | Freq: Three times a day (TID) | ORAL | 0 refills | Status: DC

## 2021-05-22 NOTE — Assessment & Plan Note (Signed)
Deteriorated off meds ?Refilled Buspirone and Bupropion today ?Referral to psychology for therapy ?Support offered ?

## 2021-05-22 NOTE — Patient Instructions (Signed)

## 2021-05-22 NOTE — Progress Notes (Signed)
? ?Subjective:  ? ? Patient ID: Valerie Crane, female    DOB: [birth year], 26 y.o.   MRN: [id number] ? ?HPI ? ?Patient presents to clinic today for her annual exam. ? ?Anxiety and Depression: Chronic, she has not been on Bupropion and Buspirone because she ran out a few months ago. She would like to get restarted on this.  She is not currently seeing a therapist but would like a referral for one.  She denies SI/HI. ? ?Flu: 11/2018 ?Tetanus: 09/2018 ?COVID: Moderna x 1 ?Pap smear: 05/2018 ?Dentist: as needed ? ?Diet: She does eat meat. She consumes fruits and veggies. She does eat some fried foods. She drinks mostly water or half/half tea. ?Exercise: None ? ? ?Review of Systems ? ?   ?Past Medical History:  ?Diagnosis Date  ? Gestational diabetes   ? History of COVID-19 03/02/2019  ? Medical history non-contributory   ? ? ?Current Outpatient Medications  ?Medication Sig Dispense Refill  ? buPROPion (WELLBUTRIN XL) 150 MG 24 hr tablet TAKE 1 TABLET BY MOUTH EVERY DAY 90 tablet 0  ? busPIRone (BUSPAR) 10 MG tablet Take 1 tablet (10 mg total) by mouth 3 (three) times daily. 270 tablet 0  ? naproxen sodium (ALEVE) 220 MG tablet Take 440 mg by mouth 2 (two) times daily as needed (pain).    ? norelgestromin-ethinyl estradiol (ORTHO EVRA) 150-35 MCG/24HR transdermal patch Place 1 patch onto the skin once a week. 3 patch 0  ? ?No current facility-administered medications for this visit.  ? ? ?Allergies  ?Allergen Reactions  ? Zoloft [Sertraline] Hives  ? ? ?Family History  ?Problem Relation Age of Onset  ? Diabetes Mother   ? ? ?Social History  ? ?Socioeconomic History  ? Marital status: Single  ?  Spouse name: Not on file  ? Number of children: Not on file  ? Years of education: Not on file  ? Highest education level: Not on file  ?Occupational History  ? Not on file  ?Tobacco Use  ? Smoking status: Former  ?  Packs/day: 0.50  ?  Types: Cigarettes  ?  Quit date: 03/08/2018  ?  Years since quitting: 3.2  ? Smokeless tobacco:  Never  ?Vaping Use  ? Vaping Use: Every day  ? Substances: Nicotine  ?Substance and Sexual Activity  ? Alcohol use: Yes  ?  Comment: socially  ? Drug use: Not Currently  ?  Types: Marijuana  ?  Comment: none since 06/05/19  ? Sexual activity: Not Currently  ?  Birth control/protection: None  ?Other Topics Concern  ? Not on file  ?Social History Narrative  ? Not on file  ? ?Social Determinants of Health  ? ?Financial Resource Strain: Not on file  ?Food Insecurity: Not on file  ?Transportation Needs: Not on file  ?Physical Activity: Not on file  ?Stress: Not on file  ?Social Connections: Not on file  ?Intimate Partner Violence: Not on file  ? ? ? ?Constitutional: Denies fever, malaise, fatigue, headache or abrupt weight changes.  ?HEENT: Denies eye pain, eye redness, ear pain, ringing in the ears, wax buildup, runny nose, nasal congestion, bloody nose, or sore throat. ?Respiratory: Denies difficulty breathing, shortness of breath, cough or sputum production.   ?Cardiovascular: Denies chest pain, chest tightness, palpitations or swelling in the hands or feet.  ?Gastrointestinal: Denies abdominal pain, bloating, constipation, diarrhea or blood in the stool.  ?GU: Denies urgency, frequency, pain with urination, burning sensation, blood in urine, odor or discharge. ?  Musculoskeletal: Denies decrease in range of motion, difficulty with gait, muscle pain or joint pain and swelling.  ?Skin: Denies redness, rashes, lesions or ulcercations.  ?Neurological: Denies dizziness, difficulty with memory, difficulty with speech or problems with balance and coordination.  ?Psych: Patient has a history of anxiety and depression.  Denies SI/HI. ? ?No other specific complaints in a complete review of systems (except as listed in HPI above). ? ?Objective:  ? Physical Exam ? ? ?BP 108/76 (BP Location: Left Arm, Patient Position: Sitting, Cuff Size: Large)   Pulse 84   Temp (!) 96.6 ?F (35.9 ?C) (Temporal)   Ht _0  (1.727 m)   Wt 262 lb  (118.8 kg)   SpO2 96%   BMI 39.84 kg/m?  ? ?Wt Readings from Last 3 Encounters:  ?04/19/20 257 lb (116.6 kg)  ?04/06/20 256 lb (116.1 kg)  ?03/08/20 254 lb (115.2 kg)  ? ? ?General: Appears her stated age, obese, in NAD. ?Skin: Warm, dry and intact.  ?HEENT: Head: normal shape and size; Eyes: sclera white, no icterus, conjunctiva pink, PERRLA and EOMs intact;  ?Neck:  Neck supple, trachea midline. No masses, lumps or thyromegaly present.  ?Cardiovascular: Normal rate and rhythm. S1,S2 noted.  No murmur, rubs or gallops noted. No JVD or BLE edema.  ?Pulmonary/Chest: Normal effort and positive vesicular breath sounds. No respiratory distress. No wheezes, rales or ronchi noted.  ?Abdomen: Soft and nontender. Normal bowel sounds.  ?Pelvic: Normal female anatomy. Cervix not visualized. Moderate amount of blood noted in vaginal vault. No CMT. Adnexa non palpable. ?Musculoskeletal: Strength 5/5 BUE/BLE. No difficulty with gait.  ?Neurological: Alert and oriented. Cranial nerves II-XII grossly intact. Coordination normal.  ?Psychiatric: Mood and affect normal. Behavior is normal. Judgment and thought content normal.  ? ? ?BMET ?   ?Component Value Date/Time  ? NA 139 03/02/2019 0740  ? NA 137 11/06/2018 1448  ? K 3.4 (L) 03/02/2019 0740  ? CL 103 03/02/2019 0740  ? CO2 20 (L) 03/02/2019 0740  ? GLUCOSE 132 (H) 03/02/2019 0740  ? BUN 11 03/02/2019 0740  ? BUN 10 11/06/2018 1448  ? CREATININE 0.87 03/02/2019 0740  ? CALCIUM 9.1 03/02/2019 0740  ? GFRNONAA >60 03/02/2019 0740  ? GFRAA >60 03/02/2019 0740  ? ? ?Lipid Panel  ?No results found for: CHOL, TRIG, HDL, CHOLHDL, VLDL, LDLCALC ? ?CBC ?   ?Component Value Date/Time  ? WBC 10.2 06/09/2019 0850  ? RBC 4.19 06/09/2019 0850  ? HGB 12.3 06/09/2019 0850  ? HGB 11.8 11/06/2018 1448  ? HCT 38.5 06/09/2019 0850  ? HCT 34.8 11/06/2018 1448  ? PLT 325 06/09/2019 0850  ? PLT 248 11/06/2018 1448  ? MCV 91.9 06/09/2019 0850  ? MCV 86 11/06/2018 1448  ? MCH 29.4 06/09/2019 0850  ?  MCHC 31.9 06/09/2019 0850  ? RDW 14.1 06/09/2019 0850  ? RDW 12.9 11/06/2018 1448  ? LYMPHSABS 2.0 05/08/2018 1559  ? EOSABS 0.1 05/08/2018 1559  ? BASOSABS 0.1 05/08/2018 1559  ? ? ?Hgb A1C ?Lab Results  ?Component Value Date  ? HGBA1C 5.5 10/09/2018  ? ? ? ? ? ? ?   ?Assessment & Plan:  ? ?Preventative Health Maintenance: ? ?Encouraged her to get a flu shot in the fall ?Tetanus UTD ?Encouraged her to get her COVID booster ?Pap smear today, withSTD screening ?Encouraged her to consume a balanced diet and exercise regimen ?Advised her to see a dentist annually. ?We will check CBC, c-Met, lipid, A1c and hep C  today ? ?RTC 1 year, sooner if needed ?Webb Silversmith, NP ? ?

## 2021-05-23 LAB — CBC
HCT: 41 % (ref 35.0–45.0)
Hemoglobin: 13.6 g/dL (ref 11.7–15.5)
MCH: 30.2 pg (ref 27.0–33.0)
MCHC: 33.2 g/dL (ref 32.0–36.0)
MCV: 90.9 fL (ref 80.0–100.0)
MPV: 10.4 fL (ref 7.5–12.5)
Platelets: 337 10*3/uL (ref 140–400)
RBC: 4.51 10*6/uL (ref 3.80–5.10)
RDW: 12.4 % (ref 11.0–15.0)
WBC: 9 10*3/uL (ref 3.8–10.8)

## 2021-05-23 LAB — COMPLETE METABOLIC PANEL WITH GFR
AG Ratio: 1.6 (calc) (ref 1.0–2.5)
ALT: 21 U/L (ref 6–29)
AST: 16 U/L (ref 10–30)
Albumin: 4.4 g/dL (ref 3.6–5.1)
Alkaline phosphatase (APISO): 73 U/L (ref 31–125)
BUN: 11 mg/dL (ref 7–25)
CO2: 25 mmol/L (ref 20–32)
Calcium: 9.3 mg/dL (ref 8.6–10.2)
Chloride: 104 mmol/L (ref 98–110)
Creat: 0.8 mg/dL (ref 0.50–0.96)
Globulin: 2.7 g/dL (calc) (ref 1.9–3.7)
Glucose, Bld: 80 mg/dL (ref 65–139)
Potassium: 4.1 mmol/L (ref 3.5–5.3)
Sodium: 141 mmol/L (ref 135–146)
Total Bilirubin: 0.3 mg/dL (ref 0.2–1.2)
Total Protein: 7.1 g/dL (ref 6.1–8.1)
eGFR: 104 mL/min/{1.73_m2} (ref >=60)

## 2021-05-23 LAB — TSH: TSH: 1.48 mIU/L

## 2021-05-23 LAB — LIPID PANEL
Cholesterol: 143 mg/dL (ref <200)
HDL: 43 mg/dL — ABNORMAL LOW (ref >=50)
LDL Cholesterol (Calc): 65 mg/dL (calc)
Non-HDL Cholesterol (Calc): 100 mg/dL (calc) (ref <130)
Total CHOL/HDL Ratio: 3.3 (calc) (ref <5.0)
Triglycerides: 269 mg/dL — ABNORMAL HIGH (ref <150)

## 2021-05-23 LAB — HEMOGLOBIN A1C
Hgb A1c MFr Bld: 5.3 % of total Hgb (ref <5.7)
Mean Plasma Glucose: 105 mg/dL
eAG (mmol/L): 5.8 mmol/L

## 2021-05-23 LAB — HEPATITIS C ANTIBODY
Hepatitis C Ab: NONREACTIVE
SIGNAL TO CUT-OFF: 0.11 (ref <1.00)

## 2021-05-24 LAB — CYTOLOGY - PAP
Adequacy: ABSENT
Chlamydia: NEGATIVE
Comment: NEGATIVE
Comment: NEGATIVE
Comment: NORMAL
Diagnosis: NEGATIVE
Neisseria Gonorrhea: NEGATIVE
Trichomonas: NEGATIVE

## 2021-05-31 IMAGING — DX DG ANKLE COMPLETE 3+V*R*
3 series · 3 of 3 positions shown · non-contrast
Comparison: None.

CLINICAL DATA: Fell downstairs, swelling, discoloration

EXAM:
RIGHT ANKLE - COMPLETE 3+ VIEW

[ankle ap]
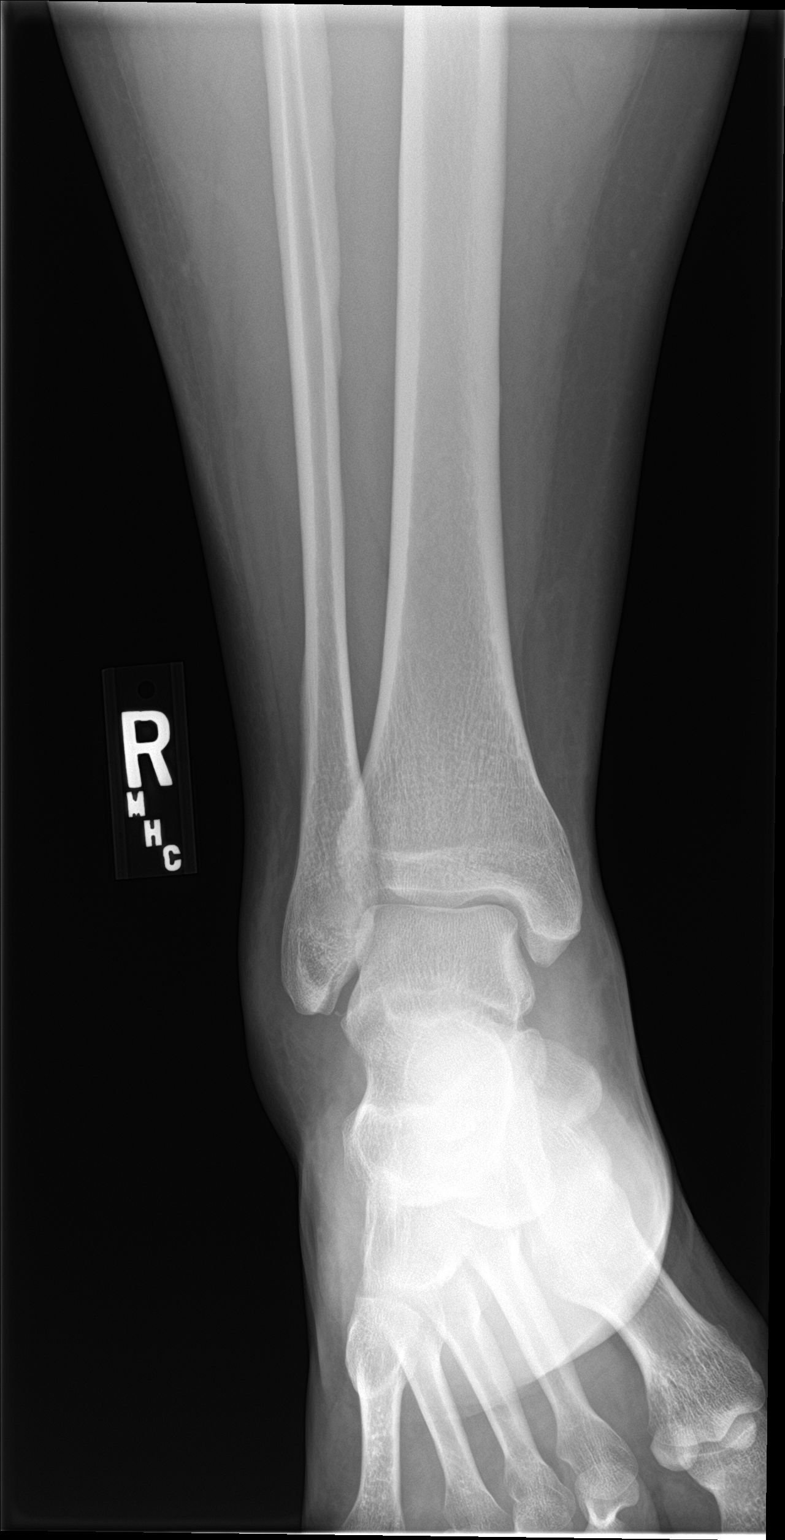

[ankle obl]
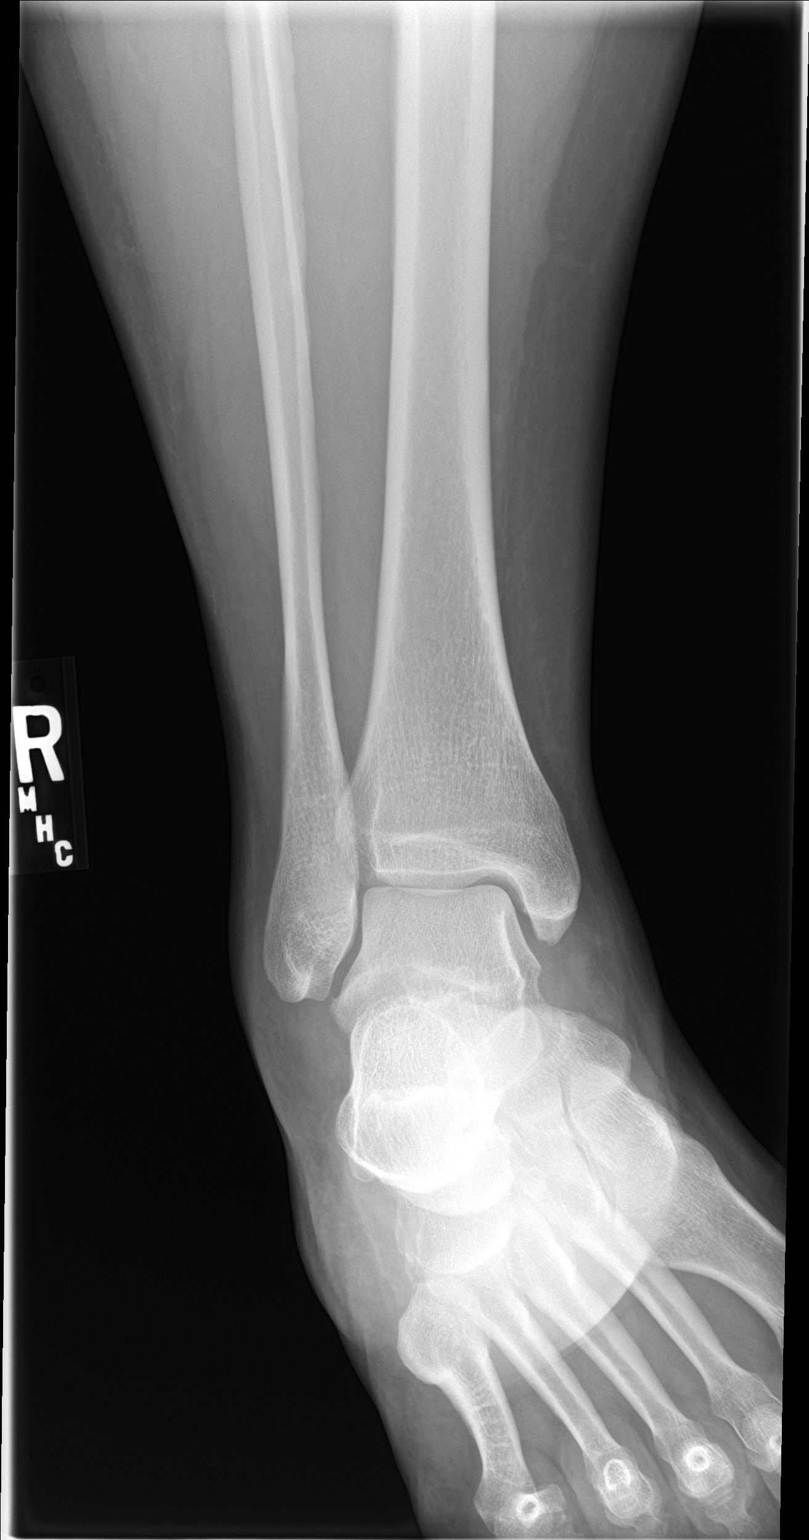

[ankle lat]
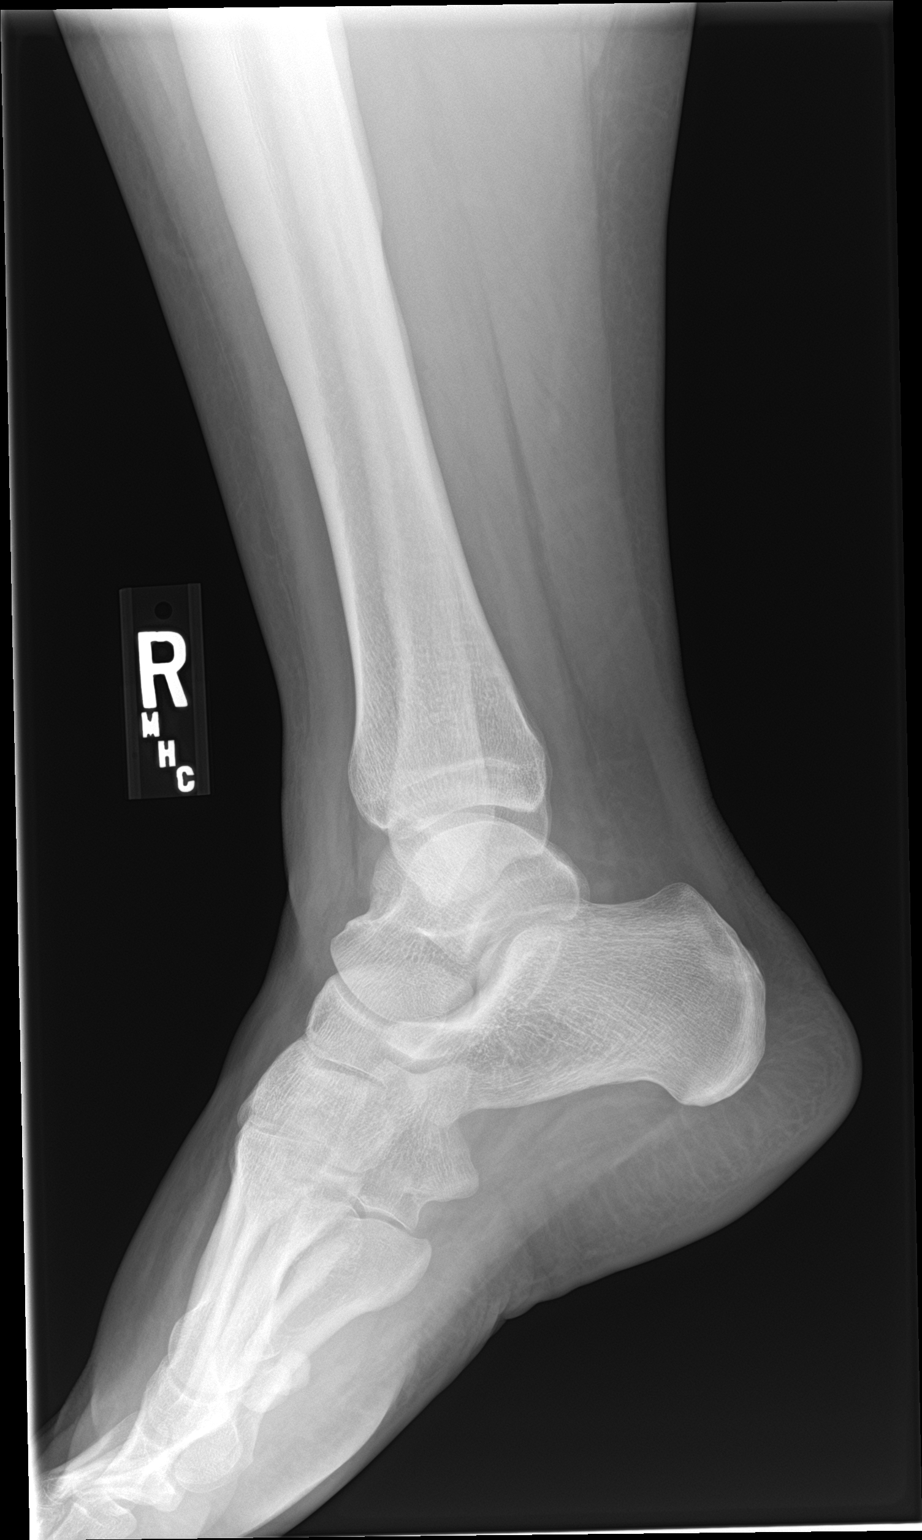

[3 of 3 positions shown; findings below may reference images not displayed]

FINDINGS: Frontal, oblique, and lateral views of the right ankle are obtained.
There is prominent anterior and lateral soft tissue swelling. A
small ossific density is seen along the lateral margin of the talus,
best visualized on the frontal view. Findings are consistent with a
small avulsion fracture, though the exact donor site is not well
visualized. Joint spaces are well preserved.
IMPRESSION: 1. Small ossific density lateral to the talus on the frontal view,
consistent with a small avulsion fracture. Exact donor site is not
well visualized.
2. Prominent anterolateral soft tissue swelling.

## 2021-09-15 ENCOUNTER — Other Ambulatory Visit: Payer: Self-pay | Admitting: Internal Medicine

## 2021-09-15 DIAGNOSIS — F419 Anxiety disorder, unspecified: Secondary | ICD-10-CM

## 2021-09-15 NOTE — Telephone Encounter (Signed)
Requested Prescriptions  Pending Prescriptions Disp Refills  . busPIRone (BUSPAR) 10 MG tablet [Pharmacy Med Name: BUSPIRONE HCL 10 MG TABLET] 90 tablet 2    Sig: TAKE 1 TABLET BY MOUTH THREE TIMES A DAY     Psychiatry: Anxiolytics/Hypnotics - Non-controlled Passed - 09/15/2021  3:09 PM      Passed - Valid encounter within last 12 months    Recent Outpatient Visits          3 months ago Encounter for general adult medical examination with abnormal findings   [facility] [place], Salvadore Oxford, NP

## 2021-10-11 ENCOUNTER — Other Ambulatory Visit: Payer: Self-pay | Admitting: Internal Medicine

## 2021-10-11 DIAGNOSIS — F419 Anxiety disorder, unspecified: Secondary | ICD-10-CM

## 2021-10-12 NOTE — Telephone Encounter (Signed)
Longer term Rx- current with appointments- protocol- yearly visit Requested Prescriptions  Pending Prescriptions Disp Refills  . busPIRone (BUSPAR) 10 MG tablet [Pharmacy Med Name: BUSPIRONE HCL 10 MG TABLET] 270 tablet 1    Sig: TAKE 1 TABLET BY MOUTH THREE TIMES A DAY     Psychiatry: Anxiolytics/Hypnotics - Non-controlled Passed - 10/11/2021  2:30 PM      Passed - Valid encounter within last 12 months    Recent Outpatient Visits          4 months ago Encounter for general adult medical examination with abnormal findings   [facility] [place], Salvadore Oxford, NP

## 2021-10-24 ENCOUNTER — Encounter: Payer: Self-pay | Admitting: Internal Medicine

## 2021-10-25 ENCOUNTER — Encounter: Payer: Self-pay | Admitting: Internal Medicine

## 2021-10-25 ENCOUNTER — Ambulatory Visit: Payer: Medicaid Other | Admitting: Internal Medicine

## 2021-10-25 DIAGNOSIS — K589 Irritable bowel syndrome without diarrhea: Secondary | ICD-10-CM | POA: Insufficient documentation

## 2021-10-25 DIAGNOSIS — F32A Depression, unspecified: Secondary | ICD-10-CM

## 2021-10-25 DIAGNOSIS — K582 Mixed irritable bowel syndrome: Secondary | ICD-10-CM

## 2021-10-25 DIAGNOSIS — F419 Anxiety disorder, unspecified: Secondary | ICD-10-CM

## 2021-10-25 NOTE — Patient Instructions (Signed)
Diet for Irritable Bowel Syndrome When you have irritable bowel syndrome (IBS), it is very important to follow the eating habits that are best for your condition. IBS may cause various symptoms, such as pain in the abdomen, constipation, or diarrhea. Choosing the right foods can help to ease the discomfort from these symptoms. Work with your health care provider and dietitian to find the eating plan that will help to control your symptoms. What are tips for following this plan?  Keep a food diary. This will help you identify foods that cause symptoms. Write down: What you eat and when you eat it. What symptoms you have. When symptoms occur in relation to your meals, such as "pain in abdomen 2 hours after dinner." Eat your meals slowly and in a relaxed setting. Aim to eat 5-6 small meals per day. Do not skip meals. Drink enough fluid to keep your urine pale yellow. Ask your health care provider if you should take an over-the-counter probiotic to help restore healthy bacteria in your gut (digestive tract). Probiotics are foods that contain good bacteria and yeasts. Your dietitian may have specific dietary recommendations for you based on your symptoms. Your dietitian may recommend that you: Avoid foods that cause symptoms. Talk with your dietitian about other ways to get the same nutrients that are in those problem foods. Avoid foods with gluten. Gluten is a protein that is found in rye, wheat, and barley. Eat more foods that contain soluble fiber. Examples of foods with high soluble fiber include oats, seeds, and certain fruits and vegetables. Take a fiber supplement if told by your dietitian. Reduce or avoid certain foods called FODMAPs. These are foods that contain sugars that are hard for some people to digest. Ask your health care provider which foods to avoid. What foods should I avoid? The following are some foods and drinks that may make your symptoms worse: Fatty foods, such as french  fries. Foods that contain gluten, such as pasta and cereal. Dairy products, such as milk, cheese, and ice cream. Spicy foods. Alcohol. Products with caffeine, such as coffee, tea, or chocolate. Carbonated drinks, such as soda. Foods that are high in FODMAPs. These include certain fruits and vegetables. Products with sweeteners such as honey, high fructose corn syrup, sorbitol, and mannitol. The items listed above may not be a complete list of foods and beverages you should avoid. Contact a dietitian for more information. What foods are good sources of fiber? Your health care provider or dietitian may recommend that you eat more foods that contain fiber. Fiber can help to reduce constipation and other IBS symptoms. Add foods with fiber to your diet a little at a time so your body can get used to them. Too much fiber at one time might cause gas and swelling of your abdomen. The following are some foods that are good sources of fiber: Berries, such as raspberries, strawberries, and blueberries. Tomatoes. Carrots. Brown rice. Oats. Seeds, such as chia and pumpkin seeds. The items listed above may not be a complete list of recommended sources of fiber. Contact your dietitian for more options. Where to find more information International Foundation for Functional Gastrointestinal Disorders: aboutibs.org National Institute of Diabetes and Digestive and Kidney Diseases: niddk.nih.gov Summary When you have irritable bowel syndrome (IBS), it is very important to follow the eating habits that are best for your condition. IBS may cause various symptoms, such as pain in the abdomen, constipation, or diarrhea. Choosing the right foods can help to ease the   discomfort that comes from symptoms. Your health care provider or dietitian may recommend that you eat more foods that contain fiber. Keep a food diary. This will help you identify foods that cause symptoms. This information is not intended to replace  advice given to you by your health care provider. Make sure you discuss any questions you have with your health care provider. Document Revised: 01/03/2021 Document Reviewed: 01/03/2021 Elsevier Patient Education  2023 Elsevier Inc.  

## 2021-10-25 NOTE — Progress Notes (Signed)
Subjective:    Patient ID: Valerie Crane, female    DOB: 1995/07/05, 26 y.o.   MRN: [id number]  HPI  Patient presents to clinic today for follow-up of anxiety and depression.  This is a chronic issue that is managed on Bupropion and Buspirone.  She is not currently seeing a therapist.  She denies SI/HI.  She also reports she has been having alternating constipation and diarrhea. She first noticed this a few months ago. She has associated nausea, abdominal cramping. She denies vomiting, bloating or blood in her stool. She denies recent changes in diet or medication. She has not tried anything OTC for this.   Review of Systems     Past Medical History:  Diagnosis Date   Gestational diabetes    History of COVID-19 03/02/2019   Medical history non-contributory     Current Outpatient Medications  Medication Sig Dispense Refill   buPROPion (WELLBUTRIN XL) 150 MG 24 hr tablet Take 1 tablet (150 mg total) by mouth daily. 90 tablet 1   busPIRone (BUSPAR) 10 MG tablet TAKE 1 TABLET BY MOUTH THREE TIMES A DAY 270 tablet 1   naproxen sodium (ALEVE) 220 MG tablet Take 440 mg by mouth 2 (two) times daily as needed (pain).     No current facility-administered medications for this visit.    Allergies  Allergen Reactions   Zoloft [Sertraline] Hives    Family History  Problem Relation Age of Onset   Diabetes Mother    Depression Mother    Bipolar disorder Mother    ADD / ADHD Mother    Drug abuse Father    Depression Father    ADD / ADHD Maternal Grandmother    Mental illness Maternal Grandmother    Diabetes Maternal Grandfather    Hypertension Maternal Grandfather    Diabetes Paternal Grandmother    Bipolar disorder Half-Sister    Breast cancer Neg Hx    Colon cancer Neg Hx    Ovarian cancer Neg Hx     Social History   Socioeconomic History   Marital status: Single    Spouse name: Not on file   Number of children: Not on file   Years of education: Not on file   Highest  education level: Not on file  Occupational History   Not on file  Tobacco Use   Smoking status: Former    Packs/day: 0.50    Types: Cigarettes    Quit date: 03/08/2018    Years since quitting: 3.6   Smokeless tobacco: Never  Vaping Use   Vaping Use: Every day   Substances: Nicotine  Substance and Sexual Activity   Alcohol use: Yes    Comment: socially   Drug use: Not Currently    Types: Marijuana    Comment: none since 06/05/19   Sexual activity: Not Currently    Birth control/protection: None  Other Topics Concern   Not on file  Social History Narrative   Not on file   Social Determinants of Health   Financial Resource Strain: Low Risk  (10/28/2018)   Overall Financial Resource Strain (CARDIA)    Difficulty of Paying Living Expenses: Not hard at all  Food Insecurity: No Food Insecurity (10/28/2018)   Hunger Vital Sign    Worried About Running Out of Food in the Last Year: Never true    Ran Out of Food in the Last Year: Never true  Transportation Needs: No Transportation Needs (10/28/2018)   PRAPARE - Transportation  Lack of Transportation (Medical): No    Lack of Transportation (Non-Medical): No  Physical Activity: Not on file  Stress: Not on file  Social Connections: Not on file  Intimate Partner Violence: Not on file     Constitutional: Denies fever, malaise, fatigue, headache or abrupt weight changes.  Respiratory: Denies difficulty breathing, shortness of breath, cough or sputum production.   Cardiovascular: Denies chest pain, chest tightness, palpitations or swelling in the hands or feet.  Gastrointestinal: Pt reports abdominal cramping, nausea, alternating constipation and diarrhea. Denies bloating, or blood in the stool.  GU: Denies urgency, frequency, pain with urination, burning sensation, blood in urine, odor or discharge. Neurological: Denies dizziness, difficulty with memory, difficulty with speech or problems with balance and coordination.  Psych: Patient  has a history of anxiety and depression.  Denies SI/HI.  No other specific complaints in a complete review of systems (except as listed in HPI above).  Objective:   Physical Exam   BP 124/82 (BP Location: Left Arm, Patient Position: Sitting, Cuff Size: Large)   Pulse 79   Temp (!) 96.9 F (36.1 C) (Temporal)   Ht 5\' 8"  (1.727 m)   Wt 254 lb (115.2 kg)   SpO2 96%   BMI 38.62 kg/m   Wt Readings from Last 3 Encounters:  05/22/21 262 lb (118.8 kg)  04/19/20 257 lb (116.6 kg)  04/06/20 256 lb (116.1 kg)    General: Appears her stated age, obese, in NAD. Cardiovascular: Normal rate and rhythm. S1,S2 noted.  No murmur, rubs or gallops noted.  Pulmonary/Chest: Normal effort and positive vesicular breath sounds. No respiratory distress. No wheezes, rales or ronchi noted.  Abdomen: Soft and nontender. Normal bowel sounds.  Musculoskeletal: No difficulty with gait.  Neurological: Alert and oriented.  Psychiatric: Mood and affect normal.  Mildly anxious appearing./  Judgment and thought content normal.    BMET    Component Value Date/Time   NA 141 05/22/2021 1046   NA 137 11/06/2018 1448   K 4.1 05/22/2021 1046   CL 104 05/22/2021 1046   CO2 25 05/22/2021 1046   GLUCOSE 80 05/22/2021 1046   BUN 11 05/22/2021 1046   BUN 10 11/06/2018 1448   CREATININE 0.80 05/22/2021 1046   CALCIUM 9.3 05/22/2021 1046   GFRNONAA >60 03/02/2019 0740   GFRAA >60 03/02/2019 0740    Lipid Panel     Component Value Date/Time   CHOL 143 05/22/2021 1046   TRIG 269 (H) 05/22/2021 1046   HDL 43 (L) 05/22/2021 1046   CHOLHDL 3.3 05/22/2021 1046   LDLCALC 65 05/22/2021 1046    CBC    Component Value Date/Time   WBC 9.0 05/22/2021 1046   RBC 4.51 05/22/2021 1046   HGB 13.6 05/22/2021 1046   HGB 11.8 11/06/2018 1448   HCT 41.0 05/22/2021 1046   HCT 34.8 11/06/2018 1448   PLT 337 05/22/2021 1046   PLT 248 11/06/2018 1448   MCV 90.9 05/22/2021 1046   MCV 86 11/06/2018 1448   MCH 30.2  05/22/2021 1046   MCHC 33.2 05/22/2021 1046   RDW 12.4 05/22/2021 1046   RDW 12.9 11/06/2018 1448   LYMPHSABS 2.0 05/08/2018 1559   EOSABS 0.1 05/08/2018 1559   BASOSABS 0.1 05/08/2018 1559    Hgb A1C Lab Results  Component Value Date   HGBA1C 5.3 05/22/2021           Assessment & Plan:    RTC in 6 months for your annual exam Webb Silversmith,  NP

## 2021-10-25 NOTE — Assessment & Plan Note (Signed)
Encourage low FODMAP diet, handout given Advised her to consume adequate fiber, can take MiraLAX or Imodium as needed

## 2021-10-25 NOTE — Assessment & Plan Note (Signed)
Continue bupropion and buspirone as prescribed Support offered

## 2021-11-12 ENCOUNTER — Other Ambulatory Visit: Payer: Self-pay | Admitting: Internal Medicine

## 2021-11-12 ENCOUNTER — Encounter: Payer: Self-pay | Admitting: Internal Medicine

## 2021-11-13 ENCOUNTER — Ambulatory Visit: Payer: Self-pay | Admitting: *Deleted

## 2021-11-13 MED ORDER — BUPROPION HCL ER (XL) 150 MG PO TB24
150.0000 mg | ORAL_TABLET | Freq: Every day | ORAL | 0 refills | Status: DC
Start: 2021-11-13 — End: 2022-10-29

## 2021-11-13 NOTE — Telephone Encounter (Signed)
Pt already called and the rx is in the rx queue, I approved it. Pt appreciative.

## 2021-11-13 NOTE — Telephone Encounter (Signed)
Answer Assessment - Initial Assessment Questions 1. REASON FOR CALL or QUESTION: "What is your reason for calling today?" or "How can I best help you?" or "What question do you have that I can help answer?"     Filled wrong med now does not have Welbutrin.  Protocols used: Information Only Call - No Triage-A-AH

## 2021-12-04 ENCOUNTER — Encounter: Payer: Self-pay | Admitting: Internal Medicine

## 2022-01-02 ENCOUNTER — Ambulatory Visit: Payer: Medicaid Other | Admitting: Internal Medicine

## 2022-01-03 ENCOUNTER — Ambulatory Visit: Payer: Medicaid Other | Admitting: Internal Medicine

## 2022-01-08 ENCOUNTER — Encounter: Payer: Self-pay | Admitting: Internal Medicine

## 2022-01-08 ENCOUNTER — Ambulatory Visit: Payer: Medicaid Other | Admitting: Internal Medicine

## 2022-01-08 VITALS — BP 136/82 | HR 83 | Temp 96.8°F | Wt 265.0 lb

## 2022-01-08 DIAGNOSIS — E66813 Obesity, class 3: Secondary | ICD-10-CM

## 2022-01-08 DIAGNOSIS — R062 Wheezing: Secondary | ICD-10-CM

## 2022-01-08 DIAGNOSIS — Z6841 Body Mass Index (BMI) 40.0 and over, adult: Secondary | ICD-10-CM | POA: Diagnosis not present

## 2022-01-08 MED ORDER — ALBUTEROL SULFATE HFA 108 (90 BASE) MCG/ACT IN AERS: 1.0000 | INHALATION_SPRAY | Freq: Four times a day (QID) | RESPIRATORY_TRACT | 0 refills | Status: DC | PRN

## 2022-01-08 NOTE — Patient Instructions (Signed)
Bronchospasm, Adult  Bronchospasm is when the small airways in the lungs narrow. This can make it very hard to breathe. Swelling and more mucus than normal can add to this problem. What are the causes? Having a cold. Exercise. The smell from sprays, perfumes, candles, and cleaners. Cold air. Stress or strong feelings such as laughing or crying. What increases the risk? Having asthma. Smoking. Being around someone who smokes (secondhand smoke). Having allergies. Being allergic to certain foods, medicine, or bug bites or stings. What are the signs or symptoms? Making a high-pitched whistling sound when you breathe, most often when you breathe out (wheezing). Coughing. A tight feeling in your chest. Feeling like you cannot catch your breath. Feeling like you have no energy to exercise. Breathing that is noisy. A cough that has a high pitch. How is this treated?  Using medicines that you breathe in (inhale). These open up the airways and help you breathe. Medicines can be taken with a metered dose inhaler or a nebulizer device. Taking medicines to reduce swelling. Getting rid of what started the bronchospasm. Follow these instructions at home: Medicines Take over-the-counter and prescription medicines only as told by your doctor. If you need to use an inhaler or nebulizer to take your medicine, ask your doctor how to use it. You may be given a spacer to use with your inhaler. This makes it easier to get the medicine from the inhaler into your lungs. Lifestyle Do not smoke or use any products that contain nicotine or tobacco. If you need help quitting, ask your doctor. Keep track of things that start your bronchospasm. Avoid these if you can. When pollen, air pollution, or humidity is bad, keep windows closed. Use an air conditioner if you have one. Find ways to cope with stress and your feelings. Activity Some people have bronchospasm when they exercise. This is called  exercise-induced bronchoconstriction (EIB). If you have this problem, talk with your doctor about how to deal with EIB. Some tips include: Use your inhaler before exercise. Exercise indoors if it is very cold or humid, or if the pollen and mold counts are high. Warm up and cool down before and after exercise. Stop your exercise right away if your symptoms start or get worse. General instructions If you have asthma, make sure you have an asthma action plan. Stay up to date on your shots (immunizations). Keep all follow-up visits. Get help right away if: You have trouble breathing. You wheeze and cough and this does not get better after you take medicine. You have chest pain. You have trouble speaking more than one word in a sentence. These symptoms may be an emergency. Get help right away. Call 911. Do not wait to see if the symptoms will go away. Do not drive yourself to the hospital. Summary Bronchospasm is when the small airways in the lungs narrow. Swelling and more mucus than normal can add to this problem. This can make it very hard to breathe. Do not smoke or use any products that contain nicotine or tobacco. If you need help quitting, ask your doctor. Get help right away if you wheeze and cough and this does not get better after you take medicine. This information is not intended to replace advice given to you by your health care provider. Make sure you discuss any questions you have with your health care provider. Document Revised: 08/15/2020 Document Reviewed: 08/15/2020 Elsevier Patient Education  2023 Elsevier Inc.  

## 2022-01-08 NOTE — Progress Notes (Signed)
Subjective:    Patient ID: Valerie Crane, female    DOB: [birth year], 26 y.o.   MRN: [id number]  HPI  Patient presents to clinic today with complaint of wheezing. She reports this started 2 months ago. She thinks she may have had bronchitis but was never seen about this.  She reports intermittent cough that is nonproductive.  She denies shortness of breath.  She has no history of asthma but did smoke for a few years.  She is concerned that this is a contributing factor.  She is also concerned about weight gain.  She does feel like she noticed this when she stopped smoking.  She does feel like she has been emotional eating since cutting out nicotine.  Her weight today is 265 pounds with BMI of 40.29.  Review of Systems     Past Medical History:  Diagnosis Date   Gestational diabetes    History of COVID-19 03/02/2019   Medical history non-contributory     Current Outpatient Medications  Medication Sig Dispense Refill   buPROPion (WELLBUTRIN XL) 150 MG 24 hr tablet Take 1 tablet (150 mg total) by mouth daily. 90 tablet 0   busPIRone (BUSPAR) 10 MG tablet TAKE 1 TABLET BY MOUTH THREE TIMES A DAY 270 tablet 1   naproxen sodium (ALEVE) 220 MG tablet Take 440 mg by mouth 2 (two) times daily as needed (pain).     No current facility-administered medications for this visit.    Allergies  Allergen Reactions   Zoloft [Sertraline] Hives    Family History  Problem Relation Age of Onset   Diabetes Mother    Depression Mother    Bipolar disorder Mother    ADD / ADHD Mother    Drug abuse Father    Depression Father    ADD / ADHD Maternal Grandmother    Mental illness Maternal Grandmother    Diabetes Maternal Grandfather    Hypertension Maternal Grandfather    Diabetes Paternal Grandmother    Bipolar disorder Half-Sister    Breast cancer Neg Hx    Colon cancer Neg Hx    Ovarian cancer Neg Hx     Social History   Socioeconomic History   Marital status: Single    Spouse  name: Not on file   Number of children: Not on file   Years of education: Not on file   Highest education level: Not on file  Occupational History   Not on file  Tobacco Use   Smoking status: Former    Packs/day: 0.50    Types: Cigarettes    Quit date: 03/08/2018    Years since quitting: 3.8   Smokeless tobacco: Never  Vaping Use   Vaping Use: Former   Substances: Nicotine  Substance and Sexual Activity   Alcohol use: Yes    Comment: socially   Drug use: Not Currently    Types: Marijuana    Comment: none since 06/05/19   Sexual activity: Not Currently    Birth control/protection: None  Other Topics Concern   Not on file  Social History Narrative   Not on file   Social Determinants of Health   Financial Resource Strain: Low Risk  (10/28/2018)   Overall Financial Resource Strain (CARDIA)    Difficulty of Paying Living Expenses: Not hard at all  Food Insecurity: No Food Insecurity (10/28/2018)   Hunger Vital Sign    Worried About Running Out of Food in the Last Year: Never true    Ran Out  of Food in the Last Year: Never true  Transportation Needs: No Transportation Needs (10/28/2018)   PRAPARE - Administrator, Civil Service (Medical): No    Lack of Transportation (Non-Medical): No  Physical Activity: Not on file  Stress: Not on file  Social Connections: Not on file  Intimate Partner Violence: Not on file     Constitutional: Denies fever, malaise, fatigue, headache or abrupt weight changes.  HEENT: Denies eye pain, eye redness, ear pain, ringing in the ears, wax buildup, runny nose, nasal congestion, bloody nose, or sore throat. Respiratory: Patient reports wheezing.  Denies difficulty breathing, shortness of breath, cough or sputum production.   Cardiovascular: Denies chest pain, chest tightness, palpitations or swelling in the hands or feet.   No other specific complaints in a complete review of systems (except as listed in HPI above).  Objective:    Physical Exam  BP 136/82 (BP Location: Left Arm, Patient Position: Sitting, Cuff Size: Normal)   Pulse 83   Temp (!) 96.8 F (36 C) (Temporal)   Wt 265 lb (120.2 kg)   SpO2 98%   BMI 40.29 kg/m   Wt Readings from Last 3 Encounters:  10/25/21 254 lb (115.2 kg)  05/22/21 262 lb (118.8 kg)  04/19/20 257 lb (116.6 kg)    General: Appears her stated age, obese, in NAD. HEENT: Head: normal shape and size; Eyes: sclera white, no icterus, conjunctiva pink, PERRLA and EOMs intact;  Cardiovascular: Normal rate and rhythm. S1,S2 noted.  No murmur, rubs or gallops noted.  Pulmonary/Chest: Normal effort and positive vesicular breath sounds with intermittent expiratory wheeze noted in the right upper lobe. No respiratory distress. No rales or ronchi noted.  Musculoskeletal:  No difficulty with gait.  Neurological: Alert and oriented.   BMET    Component Value Date/Time   NA 141 05/22/2021 1046   NA 137 11/06/2018 1448   K 4.1 05/22/2021 1046   CL 104 05/22/2021 1046   CO2 25 05/22/2021 1046   GLUCOSE 80 05/22/2021 1046   BUN 11 05/22/2021 1046   BUN 10 11/06/2018 1448   CREATININE 0.80 05/22/2021 1046   CALCIUM 9.3 05/22/2021 1046   GFRNONAA >60 03/02/2019 0740   GFRAA >60 03/02/2019 0740    Lipid Panel     Component Value Date/Time   CHOL 143 05/22/2021 1046   TRIG 269 (H) 05/22/2021 1046   HDL 43 (L) 05/22/2021 1046   CHOLHDL 3.3 05/22/2021 1046   LDLCALC 65 05/22/2021 1046    CBC    Component Value Date/Time   WBC 9.0 05/22/2021 1046   RBC 4.51 05/22/2021 1046   HGB 13.6 05/22/2021 1046   HGB 11.8 11/06/2018 1448   HCT 41.0 05/22/2021 1046   HCT 34.8 11/06/2018 1448   PLT 337 05/22/2021 1046   PLT 248 11/06/2018 1448   MCV 90.9 05/22/2021 1046   MCV 86 11/06/2018 1448   MCH 30.2 05/22/2021 1046   MCHC 33.2 05/22/2021 1046   RDW 12.4 05/22/2021 1046   RDW 12.9 11/06/2018 1448   LYMPHSABS 2.0 05/08/2018 1559   EOSABS 0.1 05/08/2018 1559   BASOSABS 0.1  05/08/2018 1559    Hgb A1C Lab Results  Component Value Date   HGBA1C 5.3 05/22/2021            Assessment & Plan:   Wheezing:  No indication for chest x-ray at this time She could have a mild reactive airway disease Rx for Albuterol 1 to 2 puffs every  4-6 hours as needed   RTC in 4 months for annual exam Nicki Reaper, NP

## 2022-01-08 NOTE — Assessment & Plan Note (Signed)
Encouraged 1400 calorie restricted diet consisting of lean meat and green veggies Encouraged exercise.

## 2022-03-09 ENCOUNTER — Other Ambulatory Visit: Payer: Self-pay | Admitting: Internal Medicine

## 2022-03-09 DIAGNOSIS — F32A Depression, unspecified: Secondary | ICD-10-CM

## 2022-03-09 NOTE — Telephone Encounter (Signed)
Requested Prescriptions  Refused Prescriptions Disp Refills   busPIRone (BUSPAR) 10 MG tablet [Pharmacy Med Name: BUSPIRONE HCL 10 MG TABLET] 270 tablet 2    Sig: TAKE 1 TABLET BY MOUTH THREE TIMES A DAY     Psychiatry: Anxiolytics/Hypnotics - Non-controlled Passed - 03/09/2022  1:30 PM      Passed - Valid encounter within last 12 months    Recent Outpatient Visits           2 months ago [facility] [place], Coralie Keens, NP   4 months ago Anxiety and depression   [facility] [place], Coralie Keens, NP   9 months ago Encounter for general adult medical examination with abnormal findings   [facility] [place], Coralie Keens, NP

## 2022-07-05 ENCOUNTER — Other Ambulatory Visit: Payer: Self-pay | Admitting: Internal Medicine

## 2022-07-06 NOTE — Telephone Encounter (Signed)
Requested medications are due for refill today.  yes  Requested medications are on the active medications list.  yes  Last refill. 11/13/2021 #90 0 rf  Future visit scheduled.   no  Notes to clinic.  Labs are expired.    Requested Prescriptions  Pending Prescriptions Disp Refills   buPROPion (WELLBUTRIN XL) 150 MG 24 hr tablet [Pharmacy Med Name: BUPROPION HCL XL 150 MG TABLET] 90 tablet 0    Sig: TAKE 1 TABLET BY MOUTH EVERY DAY     Psychiatry: Antidepressants - bupropion Failed - 07/05/2022  3:02 PM      Failed - Cr in normal range and within 360 days    Creat  Date Value Ref Range Status  05/22/2021 0.80 0.50 - 0.96 mg/dL Final   Creatinine, Urine  Date Value Ref Range Status  11/07/2018 120.59 mg/dL Final         Failed - AST in normal range and within 360 days    AST  Date Value Ref Range Status  05/22/2021 16 10 - 30 U/L Final         Failed - ALT in normal range and within 360 days    ALT  Date Value Ref Range Status  05/22/2021 21 6 - 29 U/L Final         Passed - Completed PHQ-2 or PHQ-9 in the last 360 days      Passed - Last BP in normal range    BP Readings from Last 1 Encounters:  01/08/22 136/82         Passed - Valid encounter within last 6 months    Recent Outpatient Visits           5 months ago Wheezing   [facility] [place], Salvadore Oxford, NP   8 months ago Anxiety and depression   [place] [place], Salvadore Oxford, NP   1 year ago Encounter for general adult medical examination with abnormal findings   [facility] [place], Salvadore Oxford, NP

## 2022-10-29 ENCOUNTER — Ambulatory Visit: Payer: Medicaid Other | Admitting: Internal Medicine

## 2022-10-29 ENCOUNTER — Other Ambulatory Visit (HOSPITAL_COMMUNITY)
Admission: RE | Admit: 2022-10-29 | Discharge: 2022-10-29 | Disposition: A | Discharge status: Home / Self Care | Payer: Medicaid Other | Source: Ambulatory Visit | Attending: Internal Medicine | Admitting: Internal Medicine

## 2022-10-29 ENCOUNTER — Encounter: Payer: Self-pay | Admitting: Internal Medicine

## 2022-10-29 VITALS — BP 128/86 | HR 65 | Temp 96.6°F | Ht 68.0 in | Wt 274.0 lb

## 2022-10-29 DIAGNOSIS — R300 Dysuria: Secondary | ICD-10-CM

## 2022-10-29 DIAGNOSIS — N941 Unspecified dyspareunia: Secondary | ICD-10-CM | POA: Diagnosis not present

## 2022-10-29 DIAGNOSIS — Z0001 Encounter for general adult medical examination with abnormal findings: Secondary | ICD-10-CM | POA: Diagnosis not present

## 2022-10-29 DIAGNOSIS — Z6841 Body Mass Index (BMI) 40.0 and over, adult: Secondary | ICD-10-CM | POA: Diagnosis not present

## 2022-10-29 DIAGNOSIS — R7309 Other abnormal glucose: Secondary | ICD-10-CM | POA: Diagnosis not present

## 2022-10-29 LAB — POCT URINALYSIS DIPSTICK
Bilirubin, UA: NEGATIVE
Glucose, UA: NEGATIVE
Ketones, UA: NEGATIVE
Leukocytes, UA: NEGATIVE
Nitrite, UA: NEGATIVE
Protein, UA: NEGATIVE
Spec Grav, UA: 1.015 (ref 1.010–1.025)
Urobilinogen, UA: 0.2 E.U./dL
pH, UA: 6 (ref 5.0–8.0)

## 2022-10-29 NOTE — Assessment & Plan Note (Signed)
Encourage diet and exercise for weight loss 

## 2022-10-29 NOTE — Patient Instructions (Signed)

## 2022-10-29 NOTE — Progress Notes (Signed)
Subjective:    Patient ID: Valerie Crane, female    DOB: [birth year], 27 y.o.   MRN: [id number]  HPI  Patient presents to clinic today for her annual exam.  She also reports pain and burning with urination.  This started about a month ago.  Flu: 11/2018 Tetanus: 09/2018 COVID: x 1 Pap smear: 05/2021 Dentist: as needed  Diet: She does eat meat. She consumes fruits and veggies. She tries to avoid fried foods. She drinks mostly half and half tea. Exercise: Walking    Review of Systems     Past Medical History:  Diagnosis Date   Gestational diabetes    History of COVID-19 03/02/2019   Medical history non-contributory     Current Outpatient Medications  Medication Sig Dispense Refill   albuterol (VENTOLIN HFA) 108 (90 Base) MCG/ACT inhaler Inhale 1 puff into the lungs every 6 (six) hours as needed for wheezing or shortness of breath. 8 g 0   buPROPion (WELLBUTRIN XL) 150 MG 24 hr tablet Take 1 tablet (150 mg total) by mouth daily. 90 tablet 0   busPIRone (BUSPAR) 10 MG tablet TAKE 1 TABLET BY MOUTH THREE TIMES A DAY 270 tablet 1   naproxen sodium (ALEVE) 220 MG tablet Take 440 mg by mouth 2 (two) times daily as needed (pain).     No current facility-administered medications for this visit.    Allergies  Allergen Reactions   Zoloft [Sertraline] Hives    Family History  Problem Relation Age of Onset   Diabetes Mother    Depression Mother    Bipolar disorder Mother    ADD / ADHD Mother    Drug abuse Father    Depression Father    ADD / ADHD Maternal Grandmother    Mental illness Maternal Grandmother    Diabetes Maternal Grandfather    Hypertension Maternal Grandfather    Diabetes Paternal Grandmother    Bipolar disorder Half-Sister    Breast cancer Neg Hx    Colon cancer Neg Hx    Ovarian cancer Neg Hx     Social History   Socioeconomic History   Marital status: Single    Spouse name: Not on file   Number of children: Not on file   Years of education:  Not on file   Highest education level: Not on file  Occupational History   Not on file  Tobacco Use   Smoking status: Former    Current packs/day: 0.00    Types: Cigarettes    Quit date: 03/08/2018    Years since quitting: 4.6   Smokeless tobacco: Never  Vaping Use   Vaping status: Former   Substances: Nicotine  Substance and Sexual Activity   Alcohol use: Yes    Comment: socially   Drug use: Not Currently    Types: Marijuana    Comment: none since 06/05/19   Sexual activity: Not Currently    Birth control/protection: None  Other Topics Concern   Not on file  Social History Narrative   Not on file   Social Determinants of Health   Financial Resource Strain: Low Risk  (10/28/2018)   Overall Financial Resource Strain (CARDIA)    Difficulty of Paying Living Expenses: Not hard at all  Food Insecurity: No Food Insecurity (10/28/2018)   Hunger Vital Sign    Worried About Running Out of Food in the Last Year: Never true    Ran Out of Food in the Last Year: Never true  Transportation Needs: No Transportation  Needs (10/28/2018)   PRAPARE - Administrator, Civil Service (Medical): No    Lack of Transportation (Non-Medical): No  Physical Activity: Not on file  Stress: Not on file  Social Connections: Not on file  Intimate Partner Violence: Not on file     Constitutional: Denies fever, malaise, fatigue, headache or abrupt weight changes.  HEENT: Denies eye pain, eye redness, ear pain, ringing in the ears, wax buildup, runny nose, nasal congestion, bloody nose, or sore throat. Respiratory: Denies difficulty breathing, shortness of breath, cough or sputum production.   Cardiovascular: Denies chest pain, chest tightness, palpitations or swelling in the hands or feet.  Gastrointestinal: Denies abdominal pain, bloating, constipation, diarrhea or blood in the stool.  GU: Pt reports pain and burning with urination, dyspareunia. Denies urgency, frequency, blood in urine, odor or  discharge. Musculoskeletal: Denies decrease in range of motion, difficulty with gait, muscle pain or joint pain and swelling.  Skin: Denies redness, rashes, lesions or ulcercations.  Neurological: Denies dizziness, difficulty with memory, difficulty with speech or problems with balance and coordination.  Psych: Patient has a history of anxiety and depression.  Denies SI/HI.  No other specific complaints in a complete review of systems (except as listed in HPI above).  Objective:   Physical Exam   BP 128/86 (BP Location: Left Arm, Patient Position: Sitting, Cuff Size: Large)   Pulse 65   Temp (!) 96.6 F (35.9 C) (Temporal)   Ht 5\' 8"  (1.727 m)   Wt 274 lb (124.3 kg)   SpO2 98%   BMI 41.66 kg/m   Wt Readings from Last 3 Encounters:  01/08/22 265 lb (120.2 kg)  10/25/21 254 lb (115.2 kg)  05/22/21 262 lb (118.8 kg)    General: Appears her stated age, obese, in NAD. Skin: Warm, dry and intact.  HEENT: Head: normal shape and size; Eyes: sclera white, no icterus, conjunctiva pink, PERRLA and EOMs intact;  Neck:  Neck supple, trachea midline. No masses, lumps or thyromegaly present.  Cardiovascular: Normal rate and rhythm. S1,S2 noted.  No murmur, rubs or gallops noted. No JVD or BLE edema.  Pulmonary/Chest: Normal effort and positive vesicular breath sounds. No respiratory distress. No wheezes, rales or ronchi noted.  Abdomen: Soft and nontender. Normal bowel sounds.  No CVA tenderness noted. Musculoskeletal: Strength 5/5 BUE/BLE.  No difficulty with gait.  Neurological: Alert and oriented. Cranial nerves II-XII grossly intact. Coordination normal.  Psychiatric: Mood and affect normal. Behavior is normal. Judgment and thought content normal.    BMET    Component Value Date/Time   NA 141 05/22/2021 1046   NA 137 11/06/2018 1448   K 4.1 05/22/2021 1046   CL 104 05/22/2021 1046   CO2 25 05/22/2021 1046   GLUCOSE 80 05/22/2021 1046   BUN 11 05/22/2021 1046   BUN 10 11/06/2018  1448   CREATININE 0.80 05/22/2021 1046   CALCIUM 9.3 05/22/2021 1046   GFRNONAA >60 03/02/2019 0740   GFRAA >60 03/02/2019 0740    Lipid Panel     Component Value Date/Time   CHOL 143 05/22/2021 1046   TRIG 269 (H) 05/22/2021 1046   HDL 43 (L) 05/22/2021 1046   CHOLHDL 3.3 05/22/2021 1046   LDLCALC 65 05/22/2021 1046    CBC    Component Value Date/Time   WBC 9.0 05/22/2021 1046   RBC 4.51 05/22/2021 1046   HGB 13.6 05/22/2021 1046   HGB 11.8 11/06/2018 1448   HCT 41.0 05/22/2021 1046  HCT 34.8 11/06/2018 1448   PLT 337 05/22/2021 1046   PLT 248 11/06/2018 1448   MCV 90.9 05/22/2021 1046   MCV 86 11/06/2018 1448   MCH 30.2 05/22/2021 1046   MCHC 33.2 05/22/2021 1046   RDW 12.4 05/22/2021 1046   RDW 12.9 11/06/2018 1448   LYMPHSABS 2.0 05/08/2018 1559   EOSABS 0.1 05/08/2018 1559   BASOSABS 0.1 05/08/2018 1559    Hgb A1C Lab Results  Component Value Date   HGBA1C 5.3 05/22/2021           Assessment & Plan:   Preventative health maintenance:  Flu shot declined Tetanus UTD Encouraged her to get her COVID-vaccine Pap smear UTD Encouraged her to consume a balanced diet and exercise regimen Advised her to see an eye doctor and dentist annually We will check CBC, c-Met, lipid, A1c today  Burning with urination, dysuria, dysparunia:  POCT urinalysis: trace blood Will send urine culture Push fluids Will send wet prep to rule out BV, yeast, gonorrhea, chlamydia or trichomonias  RTC in 6 months, follow-up chronic conditions Nicki Reaper, NP

## 2022-10-30 ENCOUNTER — Encounter: Payer: Self-pay | Admitting: Internal Medicine

## 2022-10-30 LAB — COMPLETE METABOLIC PANEL WITH GFR
AG Ratio: 1.5 (calc) (ref 1.0–2.5)
ALT: 25 U/L (ref 6–29)
AST: 16 U/L (ref 10–30)
Albumin: 4.5 g/dL (ref 3.6–5.1)
Alkaline phosphatase (APISO): 72 U/L (ref 31–125)
BUN: 18 mg/dL (ref 7–25)
CO2: 22 mmol/L (ref 20–32)
Calcium: 9.4 mg/dL (ref 8.6–10.2)
Chloride: 104 mmol/L (ref 98–110)
Creat: 0.77 mg/dL (ref 0.50–0.96)
Globulin: 3 g/dL (calc) (ref 1.9–3.7)
Glucose, Bld: 95 mg/dL (ref 65–99)
Potassium: 4.4 mmol/L (ref 3.5–5.3)
Sodium: 137 mmol/L (ref 135–146)
Total Bilirubin: 0.3 mg/dL (ref 0.2–1.2)
Total Protein: 7.5 g/dL (ref 6.1–8.1)
eGFR: 108 mL/min/{1.73_m2} (ref >=60)

## 2022-10-30 LAB — CBC
HCT: 42.4 % (ref 35.0–45.0)
Hemoglobin: 13.8 g/dL (ref 11.7–15.5)
MCH: 30.1 pg (ref 27.0–33.0)
MCHC: 32.5 g/dL (ref 32.0–36.0)
MCV: 92.4 fL (ref 80.0–100.0)
MPV: 10.5 fL (ref 7.5–12.5)
Platelets: 322 10*3/uL (ref 140–400)
RBC: 4.59 10*6/uL (ref 3.80–5.10)
RDW: 12 % (ref 11.0–15.0)
WBC: 9.1 10*3/uL (ref 3.8–10.8)

## 2022-10-30 LAB — LIPID PANEL
Cholesterol: 150 mg/dL (ref <200)
HDL: 41 mg/dL — ABNORMAL LOW (ref >=50)
LDL Cholesterol (Calc): 75 mg/dL (calc)
Non-HDL Cholesterol (Calc): 109 mg/dL (calc) (ref <130)
Total CHOL/HDL Ratio: 3.7 (calc) (ref <5.0)
Triglycerides: 258 mg/dL — ABNORMAL HIGH (ref <150)

## 2022-10-30 LAB — URINE CULTURE
MICRO NUMBER:: 15503592
Result:: NO GROWTH
SPECIMEN QUALITY:: ADEQUATE

## 2022-10-30 LAB — HEMOGLOBIN A1C
Hgb A1c MFr Bld: 5.4 % of total Hgb (ref <5.7)
Mean Plasma Glucose: 108 mg/dL
eAG (mmol/L): 6 mmol/L

## 2022-10-30 MED ORDER — SIMVASTATIN 10 MG PO TABS: 10.0000 mg | ORAL_TABLET | Freq: Every day | ORAL | 1 refills | Status: AC

## 2022-11-01 LAB — CERVICOVAGINAL ANCILLARY ONLY
Bacterial Vaginitis (gardnerella): NEGATIVE
Candida Glabrata: NEGATIVE
Candida Vaginitis: NEGATIVE
Chlamydia: NEGATIVE
Comment: NEGATIVE
Comment: NEGATIVE
Comment: NEGATIVE
Comment: NEGATIVE
Comment: NEGATIVE
Comment: NORMAL
Neisseria Gonorrhea: NEGATIVE
Trichomonas: NEGATIVE

## 2022-11-02 ENCOUNTER — Ambulatory Visit: Payer: Medicaid Other | Admitting: Internal Medicine

## 2022-11-15 ENCOUNTER — Encounter: Payer: Self-pay | Admitting: Internal Medicine

## 2023-02-01 ENCOUNTER — Encounter: Payer: Self-pay | Admitting: Internal Medicine

## 2023-02-01 ENCOUNTER — Ambulatory Visit: Payer: Medicaid Other | Admitting: Internal Medicine

## 2023-02-01 VITALS — BP 132/84 | Ht 68.0 in | Wt 269.2 lb

## 2023-02-01 DIAGNOSIS — F419 Anxiety disorder, unspecified: Secondary | ICD-10-CM | POA: Diagnosis not present

## 2023-02-01 DIAGNOSIS — F32A Depression, unspecified: Secondary | ICD-10-CM

## 2023-02-01 MED ORDER — BUSPIRONE HCL 10 MG PO TABS: 10.0000 mg | ORAL_TABLET | Freq: Two times a day (BID) | ORAL | 0 refills | Status: AC

## 2023-02-01 MED ORDER — BUPROPION HCL ER (XL) 150 MG PO TB24: 150.0000 mg | ORAL_TABLET | Freq: Every day | ORAL | 0 refills | Status: DC

## 2023-02-01 NOTE — Patient Instructions (Signed)
Managing Stress, Adult Feeling a certain amount of stress is normal. Stress helps our body and mind get ready to deal with the demands of life. Stress hormones can motivate you to do well at work and meet your responsibilities. But severe or long-term (chronic) stress can affect your mental and physical health. Chronic stress puts you at higher risk for: Anxiety and depression. Other health problems such as digestive problems, muscle aches, heart disease, high blood pressure, and stroke. What are the causes? Common causes of stress include: Demands from work, such as deadlines, feeling overworked, or having long hours. Pressures at home, such as money issues, disagreements with a spouse, or parenting issues. Pressures from major life changes, such as divorce, moving, loss of a loved one, or chronic illness. You may be at higher risk for stress-related problems if you: Do not get enough sleep. Are in poor health. Do not have emotional support. Have a mental health disorder such as anxiety or depression. How to recognize stress Stress can make you: Have trouble sleeping. Feel sad, anxious, irritable, or overwhelmed. Lose your appetite. Overeat or want to eat unhealthy foods. Want to use drugs or alcohol. Stress can also cause physical symptoms, such as: Sore, tense muscles, especially in the shoulders and neck. Headaches. Trouble breathing. A faster heart rate. Stomach pain, nausea, or vomiting. Diarrhea or constipation. Trouble concentrating. Follow these instructions at home: Eating and drinking Eat a healthy diet. This includes: Eating foods that are high in fiber, such as beans, whole grains, and fresh fruits and vegetables. Limiting foods that are high in fat and processed sugars, such as fried or sweet foods. Do not skip meals or overeat. Drink enough fluid to keep your urine pale yellow. Alcohol use Do not drink alcohol if: Your health care provider tells you not to  drink. You are pregnant, may be pregnant, or are planning to become pregnant. Drinking alcohol is a way some people try to ease their stress. This can be dangerous, so if you drink alcohol: Limit how much you have to: 0-1 drink a day for women. 0-2 drinks a day for men. Know how much alcohol is in your drink. In the [place] one drink equals one 12 oz bottle of beer (355 mL), one 5 oz glass of wine (148 mL), or one 1 oz glass of hard liquor (44 mL). Activity  Include 30 minutes of exercise in your daily schedule. Exercise is a good stress reducer. Include time in your day for an activity that you find relaxing. Try taking a walk, going on a bike ride, reading a book, or listening to music. Schedule your time in a way that lowers stress, and keep a regular schedule. Focus on doing what is most important to get done. Lifestyle Identify the source of your stress and your reaction to it. See a therapist who can help you change unhelpful reactions. When there are stressful events: Talk about them with family, friends, or coworkers. Try to think realistically about stressful events and not ignore them or overreact. Try to find the positives in a stressful situation and not focus on the negatives. Cut back on responsibilities at work and home, if possible. Ask for help from friends or family members if you need it. Find ways to manage stress, such as: Mindfulness, meditation, or deep breathing. Yoga or tai chi. Progressive muscle relaxation. Spending time in nature. Doing art, playing music, or reading. Making time for fun activities. Spending time with family and friends. Get support   from family, friends, or spiritual resources. General instructions Get enough sleep. Try to go to sleep and get up at about the same time every day. Take over-the-counter and prescription medicines only as told by your health care provider. Do not use any products that contain nicotine or tobacco. These products  include cigarettes, chewing tobacco, and vaping devices, such as e-cigarettes. If you need help quitting, ask your health care provider. Do not use drugs or smoke to deal with stress. Keep all follow-up visits. This is important. Where to find support Talk with your health care provider about stress management or finding a support group. Find a therapist to work with you on your stress management techniques. Where to find more information National Alliance on Mental Illness: www.nami.org American Psychological Association: www.apa.org Contact a health care provider if: Your stress symptoms get worse. You are unable to manage your stress at home. You are struggling to stop using drugs or alcohol. Get help right away if: You may be a danger to yourself or others. You have any thoughts of death or suicide. Get help right awayif you feel like you may hurt yourself or others, or have thoughts about taking your own life. Go to your nearest emergency room or: Call 911. Call the National Suicide Prevention Lifeline at 1-800-273-8255 or 988 in the [place] This is open 24 hours a day. Text the Crisis Text Line at 741741. Summary Feeling a certain amount of stress is normal, but severe or long-term (chronic) stress can affect your mental and physical health. Chronic stress can put you at higher risk for anxiety, depression, and other health problems such as digestive problems, muscle aches, heart disease, high blood pressure, and stroke. You may be at higher risk for stress-related problems if you do not get enough sleep, are in poor health, lack emotional support, or have a mental health disorder such as anxiety or depression. Identify the source of your stress and your reaction to it. Try talking about stressful events with family, friends, or coworkers, finding a coping method, or getting support from spiritual resources. If you need more help, talk with your health care provider about finding a  support group or a mental health therapist. This information is not intended to replace advice given to you by your health care provider. Make sure you discuss any questions you have with your health care provider. Document Revised: 08/18/2020 Document Reviewed: 08/16/2020 Elsevier Patient Education  2024 [employer]  

## 2023-02-01 NOTE — Progress Notes (Unsigned)
Subjective:    Patient ID: Valerie Crane, female    DOB: 01/10/1996, 27 y.o.   MRN: [id number]  HPI  Discussed the use of AI scribe software for clinical note transcription with the patient, who gave verbal consent to proceed.    The patient, with a history of anxiety, ADHD, and depression, has been off her medications for approximately a year, since November 2023. She reports a significant increase in anxiety and stress, describing it as "anxiety out the roof." She also notes a decrease in her ability to complete tasks, attributing this to her ADHD.  The patient previously found relief with Wellbutrin and Buspar and expresses a desire to restart these medications. She also mentions attending sessions with a grief therapist for the past four to five months, which she finds helpful, although she notes this is a temporary arrangement for thirteen months.  The patient denies any thoughts of self-harm but expresses a persistent feeling of wanting to "go home," which she clarifies as a desire to not be present. She denies any intent to act on these feelings.       Review of Systems     Past Medical History:  Diagnosis Date   Gestational diabetes    History of COVID-19 03/02/2019   Medical history non-contributory     Current Outpatient Medications  Medication Sig Dispense Refill   albuterol (VENTOLIN HFA) 108 (90 Base) MCG/ACT inhaler Inhale 1 puff into the lungs every 6 (six) hours as needed for wheezing or shortness of breath. 8 g 0   simvastatin (ZOCOR) 10 MG tablet Take 1 tablet (10 mg total) by mouth at bedtime. 90 tablet 1   No current facility-administered medications for this visit.    Allergies  Allergen Reactions   Zoloft [Sertraline] Hives    Family History  Problem Relation Age of Onset   Diabetes Mother    Depression Mother    Bipolar disorder Mother    ADD / ADHD Mother    Drug abuse Father    Depression Father    ADD / ADHD Maternal Grandmother    Mental  illness Maternal Grandmother    Diabetes Maternal Grandfather    Hypertension Maternal Grandfather    Diabetes Paternal Grandmother    Bipolar disorder Half-Sister    Breast cancer Neg Hx    Colon cancer Neg Hx    Ovarian cancer Neg Hx     Social History   Socioeconomic History   Marital status: Single    Spouse name: Not on file   Number of children: Not on file   Years of education: Not on file   Highest education level: Not on file  Occupational History   Not on file  Tobacco Use   Smoking status: Former    Current packs/day: 0.00    Types: Cigarettes    Quit date: 03/08/2018    Years since quitting: 4.9   Smokeless tobacco: Never  Vaping Use   Vaping status: Every Day   Substances: Nicotine  Substance and Sexual Activity   Alcohol use: Yes    Comment: socially   Drug use: Not Currently    Types: Marijuana    Comment: none since 06/05/19   Sexual activity: Not Currently    Birth control/protection: None  Other Topics Concern   Not on file  Social History Narrative   Not on file   Social Drivers of Health   Financial Resource Strain: Low Risk  (10/28/2018)   Overall Financial Resource  Strain (CARDIA)    Difficulty of Paying Living Expenses: Not hard at all  Food Insecurity: No Food Insecurity (10/28/2018)   Hunger Vital Sign    Worried About Running Out of Food in the Last Year: Never true    Ran Out of Food in the Last Year: Never true  Transportation Needs: No Transportation Needs (10/28/2018)   PRAPARE - Administrator, Civil Service (Medical): No    Lack of Transportation (Non-Medical): No  Physical Activity: Not on file  Stress: Not on file  Social Connections: Not on file  Intimate Partner Violence: Not on file     Constitutional: Denies fever, malaise, fatigue, headache or abrupt weight changes.  Respiratory: Denies difficulty breathing, shortness of breath, cough or sputum production.   Cardiovascular: Denies chest pain, chest tightness,  palpitations or swelling in the hands or feet.  Musculoskeletal: Denies decrease in range of motion, difficulty with gait, muscle pain or joint pain and swelling.  Neurological: Denies dizziness, difficulty with memory, difficulty with speech or problems with balance and coordination.  Psych: Patient has a history of anxiety and depression.  Denies SI/HI.  No other specific complaints in a complete review of systems (except as listed in HPI above).  Objective:   Physical Exam   BP 132/84 (BP Location: Left Arm, Patient Position: Sitting, Cuff Size: Large)   Ht 5\' 8"  (1.727 m)   Wt 269 lb 3.2 oz (122.1 kg)   LMP 01/18/2023 (Approximate)   BMI 40.93 kg/m    Wt Readings from Last 3 Encounters:  10/29/22 274 lb (124.3 kg)  01/08/22 265 lb (120.2 kg)  10/25/21 254 lb (115.2 kg)    General: Appears her stated age, obese, in NAD. Cardiovascular: Normal rate and rhythm.  Pulmonary/Chest: Normal effort and positive vesicular breath sounds. No respiratory distress. No wheezes, rales or ronchi noted.  Neurological: Alert and oriented.  Psychiatric: Tearful. Anxious appearing. Judgment and thought content normal.    BMET    Component Value Date/Time   NA 137 10/29/2022 1051   NA 137 11/06/2018 1448   K 4.4 10/29/2022 1051   CL 104 10/29/2022 1051   CO2 22 10/29/2022 1051   GLUCOSE 95 10/29/2022 1051   BUN 18 10/29/2022 1051   BUN 10 11/06/2018 1448   CREATININE 0.77 10/29/2022 1051   CALCIUM 9.4 10/29/2022 1051   GFRNONAA >60 03/02/2019 0740   GFRAA >60 03/02/2019 0740    Lipid Panel     Component Value Date/Time   CHOL 150 10/29/2022 1051   TRIG 258 (H) 10/29/2022 1051   HDL 41 (L) 10/29/2022 1051   CHOLHDL 3.7 10/29/2022 1051   LDLCALC 75 10/29/2022 1051    CBC    Component Value Date/Time   WBC 9.1 10/29/2022 1051   RBC 4.59 10/29/2022 1051   HGB 13.8 10/29/2022 1051   HGB 11.8 11/06/2018 1448   HCT 42.4 10/29/2022 1051   HCT 34.8 11/06/2018 1448   PLT 322  10/29/2022 1051   PLT 248 11/06/2018 1448   MCV 92.4 10/29/2022 1051   MCV 86 11/06/2018 1448   MCH 30.1 10/29/2022 1051   MCHC 32.5 10/29/2022 1051   RDW 12.0 10/29/2022 1051   RDW 12.9 11/06/2018 1448   LYMPHSABS 2.0 05/08/2018 1559   EOSABS 0.1 05/08/2018 1559   BASOSABS 0.1 05/08/2018 1559    Hgb A1C Lab Results  Component Value Date   HGBA1C 5.4 10/29/2022  Assessment & Plan:    RTC in 3 months, follow-up chronic conditions Nicki Reaper, NP

## 2023-02-01 NOTE — Assessment & Plan Note (Signed)
Deteriorated Will restart buproprion and buspirone at previous dose Support offered

## 2023-02-04 ENCOUNTER — Encounter: Payer: Self-pay | Admitting: Internal Medicine

## 2023-02-07 ENCOUNTER — Ambulatory Visit: Payer: Medicaid Other | Admitting: Internal Medicine

## 2023-03-15 ENCOUNTER — Encounter: Payer: Self-pay | Admitting: Internal Medicine

## 2023-03-15 ENCOUNTER — Telehealth: Payer: Medicaid Other | Admitting: Internal Medicine

## 2023-03-15 DIAGNOSIS — J069 Acute upper respiratory infection, unspecified: Secondary | ICD-10-CM | POA: Diagnosis not present

## 2023-03-15 DIAGNOSIS — J0110 Acute frontal sinusitis, unspecified: Secondary | ICD-10-CM

## 2023-03-15 MED ORDER — PREDNISONE 10 MG PO TABS
ORAL_TABLET | ORAL | 0 refills | Status: DC
Start: 2023-03-15 — End: 2023-04-26

## 2023-03-15 MED ORDER — AMOXICILLIN-POT CLAVULANATE 875-125 MG PO TABS
1.0000 | ORAL_TABLET | Freq: Two times a day (BID) | ORAL | 0 refills | Status: DC
Start: 2023-03-15 — End: 2023-04-26

## 2023-03-15 NOTE — Patient Instructions (Signed)

## 2023-03-15 NOTE — Progress Notes (Signed)
 Virtual Visit via Video Note  I connected with Valerie Crane on 03/15/23 at 11:20 AM EST by a video enabled telemedicine application and verified that I am speaking with the correct person using two identifiers.  Location: Patient: Home Provider: Office  Person's participating in this video call: Angeline Laura, NP-C and Makari Sanko   I discussed the limitations of evaluation and management by telemedicine and the availability of in person appointments. The patient expressed understanding and agreed to proceed.  History of Present Illness:  Discussed the use of AI scribe software for clinical note transcription with the patient, who gave verbal consent to proceed.  Valerie Crane is a 28 year old female who presents with sinus pressure headache following a presumed flu infection.  She has been experiencing a severe sinus pressure headache for the past three days, described as 'really bad' and unlike anything she has experienced before. The headache is severe enough to disrupt her sleep, and over-the-counter medications such as Sudafed, Advil , and Nyquil have not provided relief. She attempted to use a saline nasal spray without success.  She notes a runny nose with bright neon green nasal discharge. No ear pain or sore throat. Occasional coughing causes significant pain in the sinus area.  She reports nausea, vomiting, and diarrhea, but denies current fever, chills, or body aches.  Last week, she experienced symptoms she believed to be the flu, which she describes as worse than COVID. She did not seek medical diagnosis at that time and managed her symptoms with DayQuil.       Past Medical History:  Diagnosis Date   Gestational diabetes    History of COVID-19 03/02/2019   Medical history non-contributory     Current Outpatient Medications  Medication Sig Dispense Refill   buPROPion  (WELLBUTRIN  XL) 150 MG 24 hr tablet Take 1 tablet (150 mg total) by mouth  daily. 90 tablet 0   busPIRone  (BUSPAR ) 10 MG tablet Take 1 tablet (10 mg total) by mouth 2 (two) times daily. 180 tablet 0   simvastatin  (ZOCOR ) 10 MG tablet Take 1 tablet (10 mg total) by mouth at bedtime. (Patient not taking: Reported on 02/01/2023) 90 tablet 1   No current facility-administered medications for this visit.    Allergies  Allergen Reactions   Zoloft  [Sertraline ] Hives    Family History  Problem Relation Age of Onset   Diabetes Mother    Depression Mother    Bipolar disorder Mother    ADD / ADHD Mother    Drug abuse Father    Depression Father    ADD / ADHD Maternal Grandmother    Mental illness Maternal Grandmother    Diabetes Maternal Grandfather    Hypertension Maternal Grandfather    Diabetes Paternal Grandmother    Bipolar disorder Half-Sister    Breast cancer Neg Hx    Colon cancer Neg Hx    Ovarian cancer Neg Hx     Social History   Socioeconomic History   Marital status: Single    Spouse name: Not on file   Number of children: Not on file   Years of education: Not on file   Highest education level: Not on file  Occupational History   Not on file  Tobacco Use   Smoking status: Former    Current packs/day: 0.00    Types: Cigarettes    Quit date: 03/08/2018    Years since quitting: 5.0   Smokeless tobacco: Never  Vaping Use   Vaping  status: Every Day   Substances: Nicotine, THC  Substance and Sexual Activity   Alcohol use: Yes    Comment: socially   Drug use: Yes    Types: Marijuana    Comment: none since 06/05/19   Sexual activity: Not Currently    Birth control/protection: None  Other Topics Concern   Not on file  Social History Narrative   Not on file   Social Drivers of Health   Financial Resource Strain: Low Risk  (10/28/2018)   Overall Financial Resource Strain (CARDIA)    Difficulty of Paying Living Expenses: Not hard at all  Food Insecurity: No Food Insecurity (10/28/2018)   Hunger Vital Sign    Worried About Running Out  of Food in the Last Year: Never true    Ran Out of Food in the Last Year: Never true  Transportation Needs: No Transportation Needs (10/28/2018)   PRAPARE - Administrator, Civil Service (Medical): No    Lack of Transportation (Non-Medical): No  Physical Activity: Not on file  Stress: Not on file  Social Connections: Not on file  Intimate Partner Violence: Not on file     Constitutional: Pt reports headache. Denies fever, malaise, fatigue, or abrupt weight changes.  HEENT: Pt reports sinus pressure, runny nose, nasal congestion. Denies eye pain, eye redness, ear pain, ringing in the ears, wax buildup, bloody nose, or sore throat. Respiratory: Pt reports cough. Denies difficulty breathing, shortness of breath.   Cardiovascular: Denies chest pain, chest tightness, palpitations or swelling in the hands or feet.  Gastrointestinal: Pt reports nausea, vomiting, and diarrhea. Denies abdominal pain, bloating, constipation, or blood in the stool.  Musculoskeletal: Denies decrease in range of motion, difficulty with gait, muscle pain or joint pain and swelling.  Neurological: Denies dizziness, difficulty with memory, difficulty with speech or problems with balance and coordination.   No other specific complaints in a complete review of systems (except as listed in HPI above).  Observations/Objective:   Wt Readings from Last 3 Encounters:  02/01/23 269 lb 3.2 oz (122.1 kg)  10/29/22 274 lb (124.3 kg)  01/08/22 265 lb (120.2 kg)    General: Appears her stated age, appears unwell but in NAD. HEENT: Head: normal shape and size, frontal sinus tenderness reported; Nose: Congestion noted; Throat/Mouth: Hoarseness noted.  Pulmonary/Chest: Normal effort. No respiratory distress.  Neurological: Alert and oriented.  BMET    Component Value Date/Time   NA 137 10/29/2022 1051   NA 137 11/06/2018 1448   K 4.4 10/29/2022 1051   CL 104 10/29/2022 1051   CO2 22 10/29/2022 1051   GLUCOSE 95  10/29/2022 1051   BUN 18 10/29/2022 1051   BUN 10 11/06/2018 1448   CREATININE 0.77 10/29/2022 1051   CALCIUM 9.4 10/29/2022 1051   GFRNONAA >60 03/02/2019 0740   GFRAA >60 03/02/2019 0740    Lipid Panel     Component Value Date/Time   CHOL 150 10/29/2022 1051   TRIG 258 (H) 10/29/2022 1051   HDL 41 (L) 10/29/2022 1051   CHOLHDL 3.7 10/29/2022 1051   LDLCALC 75 10/29/2022 1051    CBC    Component Value Date/Time   WBC 9.1 10/29/2022 1051   RBC 4.59 10/29/2022 1051   HGB 13.8 10/29/2022 1051   HGB 11.8 11/06/2018 1448   HCT 42.4 10/29/2022 1051   HCT 34.8 11/06/2018 1448   PLT 322 10/29/2022 1051   PLT 248 11/06/2018 1448   MCV 92.4 10/29/2022 1051   MCV  86 11/06/2018 1448   MCH 30.1 10/29/2022 1051   MCHC 32.5 10/29/2022 1051   RDW 12.0 10/29/2022 1051   RDW 12.9 11/06/2018 1448   LYMPHSABS 2.0 05/08/2018 1559   EOSABS 0.1 05/08/2018 1559   BASOSABS 0.1 05/08/2018 1559    Hgb A1C Lab Results  Component Value Date   HGBA1C 5.4 10/29/2022       Assessment and Plan:  Assessment and Plan    Acute Frontal Sinusitis, Viral Respiratory Illness Severe sinus pressure headache, nasal congestion, and green nasal discharge following a presumed flu infection. No relief with over-the-counter medications including Sudafed, DayQuil, Advil , Nyquil, and saline nasal spray. -Start Augmentin  875mg  for presumed bacterial sinusitis. -Start Prednisone  taper for 6 days to reduce inflammation. -Encouraged rest and fluids -Okay to alternate Tylenol  or ibuprofen  OTC as needed for headache  Follow-up in 1 month for follow-up of chronic conditions      Follow Up Instructions:    I discussed the assessment and treatment plan with the patient. The patient was provided an opportunity to ask questions and all were answered. The patient agreed with the plan and demonstrated an understanding of the instructions.   The patient was advised to call back or seek an in-person evaluation  if the symptoms worsen or if the condition fails to improve as anticipated.   Angeline Laura, NP

## 2023-04-25 ENCOUNTER — Encounter: Payer: Self-pay | Admitting: Internal Medicine

## 2023-04-26 ENCOUNTER — Telehealth: Admitting: Internal Medicine

## 2023-04-26 ENCOUNTER — Ambulatory Visit: Payer: Self-pay | Admitting: Internal Medicine

## 2023-04-26 ENCOUNTER — Encounter: Payer: Self-pay | Admitting: Internal Medicine

## 2023-04-26 DIAGNOSIS — E782 Mixed hyperlipidemia: Secondary | ICD-10-CM | POA: Diagnosis not present

## 2023-04-26 DIAGNOSIS — Z6841 Body Mass Index (BMI) 40.0 and over, adult: Secondary | ICD-10-CM | POA: Diagnosis not present

## 2023-04-26 DIAGNOSIS — R4589 Other symptoms and signs involving emotional state: Secondary | ICD-10-CM

## 2023-04-26 DIAGNOSIS — F32A Depression, unspecified: Secondary | ICD-10-CM

## 2023-04-26 DIAGNOSIS — F419 Anxiety disorder, unspecified: Secondary | ICD-10-CM

## 2023-04-26 DIAGNOSIS — K582 Mixed irritable bowel syndrome: Secondary | ICD-10-CM

## 2023-04-26 DIAGNOSIS — E66813 Obesity, class 3: Secondary | ICD-10-CM | POA: Diagnosis not present

## 2023-04-26 MED ORDER — BUPROPION HCL ER (XL) 300 MG PO TB24: 300.0000 mg | ORAL_TABLET | Freq: Every day | ORAL | 1 refills | Status: AC

## 2023-04-26 NOTE — Assessment & Plan Note (Signed)
 Not taking simvastatin as prescribed Encouraged low fat diet

## 2023-04-26 NOTE — Patient Instructions (Signed)

## 2023-04-26 NOTE — Progress Notes (Signed)
 Virtual Visit via Video Note  I connected with Valerie Crane on 04/26/23 at  3:00 PM EDT by a video enabled telemedicine application and verified that I am speaking with the correct person using two identifiers.  Location: Patient: Home Provider: Office  Person's participating in this video call: Nicki Reaper, NP-C and Elliott Quade   I discussed the limitations of evaluation and management by telemedicine and the availability of in person appointments. The patient expressed understanding and agreed to proceed.  History of Present Illness:  Patient due for follow-up of chronic conditions.  Anxiety and depression: Chronic, managed on bupropion and buspirone. She does feel like this has been worse lately. She has been having anger outbursts, intentionally scratched herself today. She is not currently seeing a therapist.  She denies SI/HI.  HLD: Her last LDL was 75, triglycerides 756, 10/2022.  She is not taking simvastatin as prescribed.  She has been trying to consume a low-fat diet.   IBS: She reports alternating constipation and diarrhea.  She is not currently taking any medications for this.  There is no colonoscopy on file.  She does not follow with GI.  Past Medical History:  Diagnosis Date   Gestational diabetes    History of COVID-19 03/02/2019   Medical history non-contributory     Current Outpatient Medications  Medication Sig Dispense Refill   amoxicillin-clavulanate (AUGMENTIN) 875-125 MG tablet Take 1 tablet by mouth 2 (two) times daily. 20 tablet 0   buPROPion (WELLBUTRIN XL) 150 MG 24 hr tablet Take 1 tablet (150 mg total) by mouth daily. 90 tablet 0   busPIRone (BUSPAR) 10 MG tablet Take 1 tablet (10 mg total) by mouth 2 (two) times daily. 180 tablet 0   predniSONE (DELTASONE) 10 MG tablet Take 6 tabs on day 1, 5 tabs on day 2, 4 tabs on day 3, 3 tabs on day 4, 2 tabs on day 5, 1 tab on day 6 21 tablet 0   simvastatin (ZOCOR) 10 MG tablet Take 1 tablet (10 mg total)  by mouth at bedtime. (Patient not taking: Reported on 02/01/2023) 90 tablet 1   No current facility-administered medications for this visit.    Allergies  Allergen Reactions   Zoloft [Sertraline] Hives    Family History  Problem Relation Age of Onset   Diabetes Mother    Depression Mother    Bipolar disorder Mother    ADD / ADHD Mother    Drug abuse Father    Depression Father    ADD / ADHD Maternal Grandmother    Mental illness Maternal Grandmother    Diabetes Maternal Grandfather    Hypertension Maternal Grandfather    Diabetes Paternal Grandmother    Bipolar disorder Half-Sister    Breast cancer Neg Hx    Colon cancer Neg Hx    Ovarian cancer Neg Hx     Social History   Socioeconomic History   Marital status: Single    Spouse name: Not on file   Number of children: Not on file   Years of education: Not on file   Highest education level: Not on file  Occupational History   Not on file  Tobacco Use   Smoking status: Former    Current packs/day: 0.00    Types: Cigarettes    Quit date: 03/08/2018    Years since quitting: 5.1   Smokeless tobacco: Never  Vaping Use   Vaping status: Every Day   Substances: Nicotine, THC  Substance and Sexual Activity  Alcohol use: Yes    Comment: socially   Drug use: Yes    Types: Marijuana    Comment: none since 06/05/19   Sexual activity: Not Currently    Birth control/protection: None  Other Topics Concern   Not on file  Social History Narrative   Not on file   Social Drivers of Health   Financial Resource Strain: Low Risk  (10/28/2018)   Overall Financial Resource Strain (CARDIA)    Difficulty of Paying Living Expenses: Not hard at all  Food Insecurity: No Food Insecurity (10/28/2018)   Hunger Vital Sign    Worried About Running Out of Food in the Last Year: Never true    Ran Out of Food in the Last Year: Never true  Transportation Needs: No Transportation Needs (10/28/2018)   PRAPARE - Scientist, research (physical sciences) (Medical): No    Lack of Transportation (Non-Medical): No  Physical Activity: Not on file  Stress: Not on file  Social Connections: Not on file  Intimate Partner Violence: Not on file     Constitutional: Denies fever, malaise, fatigue, headache or abrupt weight changes.  HEENT: Denies eye pain, eye redness, ear pain, ringing in the ears, wax buildup, runny nose, nasal congestion, bloody nose, or sore throat. Respiratory: Denies difficulty breathing, shortness of breath, cough or sputum production.   Cardiovascular: Denies chest pain, chest tightness, palpitations or swelling in the hands or feet.  Gastrointestinal: Patient reports alternating constipation and diarrhea.  Denies abdominal pain, bloating, or blood in the stool.  GU: Denies urgency, frequency, pain with urination, burning sensation, blood in urine, odor or discharge. Musculoskeletal: Denies decrease in range of motion, difficulty with gait, muscle pain or joint pain and swelling.  Skin: Denies redness, rashes, lesions or ulcercations.  Neurological: Denies dizziness, difficulty with memory, difficulty with speech or problems with balance and coordination.  Psych: Patient has a history of anxiety and depression.  Denies SI/HI.  No other specific complaints in a complete review of systems (except as listed in HPI above).  Observations/Objective:   Wt Readings from Last 3 Encounters:  02/01/23 269 lb 3.2 oz (122.1 kg)  10/29/22 274 lb (124.3 kg)  01/08/22 265 lb (120.2 kg)    General: Appears her stated age, obese, in NAD. Skin: Warm, dry and intact. Pulmonary/Chest: Normal effort. No respiratory distress.  Musculoskeletal: No difficulty with gait.  Neurological: Alert and oriented. Coordination normal.  Psychiatric: Mood and affect normal. Behavior is normal. Judgment and thought content normal.    BMET    Component Value Date/Time   NA 137 10/29/2022 1051   NA 137 11/06/2018 1448   K 4.4  10/29/2022 1051   CL 104 10/29/2022 1051   CO2 22 10/29/2022 1051   GLUCOSE 95 10/29/2022 1051   BUN 18 10/29/2022 1051   BUN 10 11/06/2018 1448   CREATININE 0.77 10/29/2022 1051   CALCIUM 9.4 10/29/2022 1051   GFRNONAA >60 03/02/2019 0740   GFRAA >60 03/02/2019 0740    Lipid Panel     Component Value Date/Time   CHOL 150 10/29/2022 1051   TRIG 258 (H) 10/29/2022 1051   HDL 41 (L) 10/29/2022 1051   CHOLHDL 3.7 10/29/2022 1051   LDLCALC 75 10/29/2022 1051    CBC    Component Value Date/Time   WBC 9.1 10/29/2022 1051   RBC 4.59 10/29/2022 1051   HGB 13.8 10/29/2022 1051   HGB 11.8 11/06/2018 1448   HCT 42.4 10/29/2022 1051  HCT 34.8 11/06/2018 1448   PLT 322 10/29/2022 1051   PLT 248 11/06/2018 1448   MCV 92.4 10/29/2022 1051   MCV 86 11/06/2018 1448   MCH 30.1 10/29/2022 1051   MCHC 32.5 10/29/2022 1051   RDW 12.0 10/29/2022 1051   RDW 12.9 11/06/2018 1448   LYMPHSABS 2.0 05/08/2018 1559   EOSABS 0.1 05/08/2018 1559   BASOSABS 0.1 05/08/2018 1559    Hgb A1C Lab Results  Component Value Date   HGBA1C 5.4 10/29/2022       Assessment and Plan:  RTC in 6 months for your annual exam  Follow Up Instructions:    I discussed the assessment and treatment plan with the patient. The patient was provided an opportunity to ask questions and all were answered. The patient agreed with the plan and demonstrated an understanding of the instructions.   The patient was advised to call back or seek an in-person evaluation if the symptoms worsen or if the condition fails to improve as anticipated.   Nicki Reaper, NP

## 2023-04-26 NOTE — Assessment & Plan Note (Signed)
 Deteriorated Will increase bupropion to 300 mg XL daily Continue buspirone at current dose Support offered

## 2023-04-26 NOTE — Assessment & Plan Note (Signed)
 Encourage diet and exercise for weight loss

## 2023-04-26 NOTE — Assessment & Plan Note (Signed)
Encourage low FODMAP diet, handout given Advised her to consume adequate fiber, can take MiraLAX or Imodium as needed

## 2023-04-29 ENCOUNTER — Other Ambulatory Visit: Payer: Self-pay | Admitting: Internal Medicine

## 2023-04-30 NOTE — Telephone Encounter (Signed)
 Discontinued None Lorre Munroe, NP 04/26/23 1457        Dose was increased. Requested Prescriptions  Refused Prescriptions Disp Refills   buPROPion (WELLBUTRIN XL) 150 MG 24 hr tablet [Pharmacy Med Name: BUPROPION HCL XL 150 MG TABLET] 90 tablet 0    Sig: TAKE 1 TABLET BY MOUTH EVERY DAY     Psychiatry: Antidepressants - bupropion Passed - 04/30/2023 10:15 AM      Passed - Cr in normal range and within 360 days    Creat  Date Value Ref Range Status  10/29/2022 0.77 0.50 - 0.96 mg/dL Final   Creatinine, Urine  Date Value Ref Range Status  11/07/2018 120.59 mg/dL Final         Passed - AST in normal range and within 360 days    AST  Date Value Ref Range Status  10/29/2022 16 10 - 30 U/L Final         Passed - ALT in normal range and within 360 days    ALT  Date Value Ref Range Status  10/29/2022 25 6 - 29 U/L Final         Passed - Completed PHQ-2 or PHQ-9 in the last 360 days      Passed - Last BP in normal range    BP Readings from Last 1 Encounters:  02/01/23 132/84         Passed - Valid encounter within last 6 months    Recent Outpatient Visits           2 months ago Anxiety and depression   [facility] [place], Salvadore Oxford, NP   6 months ago Encounter for general adult medical examination with abnormal findings   [facility] [place], Salvadore Oxford, NP   1 year ago Wheezing   [facility] [place], Salvadore Oxford, NP   1 year ago Anxiety and depression   [facility] [place], Salvadore Oxford, NP   1 year ago Encounter for general adult medical examination with abnormal findings   [place] [place], Salvadore Oxford, NP

## 2023-06-06 ENCOUNTER — Other Ambulatory Visit: Payer: Self-pay | Admitting: Internal Medicine

## 2023-06-10 NOTE — Telephone Encounter (Signed)
 D/C 04/26/23. Requested Prescriptions  Refused Prescriptions Disp Refills   buPROPion  (WELLBUTRIN  XL) 150 MG 24 hr tablet [Pharmacy Med Name: BUPROPION  HCL XL 150 MG TABLET] 90 tablet 0    Sig: TAKE 1 TABLET BY MOUTH EVERY DAY     There is no refill protocol information for this order

## 2023-06-11 ENCOUNTER — Other Ambulatory Visit: Payer: Self-pay | Admitting: Internal Medicine

## 2023-12-14 ENCOUNTER — Encounter: Payer: Self-pay | Admitting: Internal Medicine

## 2023-12-16 ENCOUNTER — Telehealth: Admitting: Internal Medicine

## 2023-12-16 DIAGNOSIS — J329 Chronic sinusitis, unspecified: Secondary | ICD-10-CM | POA: Diagnosis not present

## 2023-12-16 DIAGNOSIS — B9789 Other viral agents as the cause of diseases classified elsewhere: Secondary | ICD-10-CM | POA: Diagnosis not present

## 2023-12-16 MED ORDER — IPRATROPIUM BROMIDE 0.06 % NA SOLN: 2.0000 | Freq: Four times a day (QID) | NASAL | 0 refills | Status: AC

## 2023-12-16 NOTE — Patient Instructions (Signed)

## 2023-12-16 NOTE — Progress Notes (Signed)
 Virtual Visit via Video Note  I connected with Valerie Crane on 12/16/23 at  3:40 PM EST by a video enabled telemedicine application and verified that I am speaking with the correct person using two identifiers.  Location: Patient: Home Provider: Office  Person's participating in this video call: Angeline Laura, NP-C and Evaristo Oz   I discussed the limitations of evaluation and management by telemedicine and the availability of in person appointments. The patient expressed understanding and agreed to proceed.  History of Present Illness:   Discussed the use of AI scribe software for clinical note transcription with the patient, who gave verbal consent to proceed.  Valerie Crane Demi Brodt is a 28 year old female who presents with symptoms of a viral upper respiratory infection.  Symptoms began this past weekend with a general feeling of illness. She associates the onset with smoking cigarettes and vaping. Her daughter has also been unwell, and she suspects she may have contracted the illness from her.  She experiences sinus congestion, runny nose, and a cough. The sinus congestion has lessened in severity, and the runny nose is intermittent with a lot of mucus. The cough is random and not constant, and she reports postnasal drip, especially at night. No sore throat is present.  Over the past weekend, she experienced gastrointestinal symptoms including nausea, vomiting, and diarrhea. She wonders if this could be related to not wanting to eat and only consuming liquids. Fever, chills, body aches, and sweating were also present but have improved with Tylenol  650 mg, taken as needed. Nasal congestion persists, and she uses a humidifier for relief.  She has not conducted any over-the-counter testing for COVID-19 or flu. For symptom relief, she has been using Vicks and NyQuil, taking NyQuil for two consecutive nights. She wakes up feeling nauseous and sweating, which she attributes to  her illness over the weekend.  She smokes cigarettes and vapes.      Past Medical History:  Diagnosis Date   Gestational diabetes    History of COVID-19 03/02/2019   Medical history non-contributory     Current Outpatient Medications  Medication Sig Dispense Refill   buPROPion  (WELLBUTRIN  XL) 300 MG 24 hr tablet Take 1 tablet (300 mg total) by mouth daily. 90 tablet 1   busPIRone  (BUSPAR ) 10 MG tablet Take 1 tablet (10 mg total) by mouth 2 (two) times daily. 180 tablet 0   simvastatin  (ZOCOR ) 10 MG tablet Take 1 tablet (10 mg total) by mouth at bedtime. (Patient not taking: Reported on 02/01/2023) 90 tablet 1   No current facility-administered medications for this visit.    Allergies  Allergen Reactions   Zoloft  [Sertraline ] Hives    Family History  Problem Relation Age of Onset   Diabetes Mother    Depression Mother    Bipolar disorder Mother    ADD / ADHD Mother    Drug abuse Father    Depression Father    ADD / ADHD Maternal Grandmother    Mental illness Maternal Grandmother    Diabetes Maternal Grandfather    Hypertension Maternal Grandfather    Diabetes Paternal Grandmother    Bipolar disorder Half-Sister    Breast cancer Neg Hx    Colon cancer Neg Hx    Ovarian cancer Neg Hx     Social History   Socioeconomic History   Marital status: Single    Spouse name: Not on file   Number of children: Not on file   Years of education: Not  on file   Highest education level: Not on file  Occupational History   Not on file  Tobacco Use   Smoking status: Former    Current packs/day: 0.00    Types: Cigarettes    Quit date: 03/08/2018    Years since quitting: 5.7   Smokeless tobacco: Never  Vaping Use   Vaping status: Every Day   Substances: Nicotine, THC  Substance and Sexual Activity   Alcohol use: Yes    Comment: socially   Drug use: Yes    Types: Marijuana    Comment: none since 06/05/19   Sexual activity: Not Currently    Birth control/protection: None   Other Topics Concern   Not on file  Social History Narrative   Not on file   Social Drivers of Health   Financial Resource Strain: Low Risk  (10/28/2018)   Overall Financial Resource Strain (CARDIA)    Difficulty of Paying Living Expenses: Not hard at all  Food Insecurity: No Food Insecurity (10/28/2018)   Hunger Vital Sign    Worried About Running Out of Food in the Last Year: Never true    Ran Out of Food in the Last Year: Never true  Transportation Needs: No Transportation Needs (10/28/2018)   PRAPARE - Administrator, Civil Service (Medical): No    Lack of Transportation (Non-Medical): No  Physical Activity: Not on file  Stress: Not on file  Social Connections: Not on file  Intimate Partner Violence: Not on file     Constitutional: Pt reports intermittent headache, fever, chills. Denies malaise, fatigue, or abrupt weight changes.  HEENT: Pt reports sinus pressure, nasal congestion and runny nose. Denies eye pain, eye redness, ear pain, ringing in the ears, wax buildup, bloody nose, or sore throat. Respiratory: Pt reports cough and shortness of breath. Denies difficulty breathing.   Cardiovascular: Denies chest pain, chest tightness, palpitations or swelling in the hands or feet.  Gastrointestinal: Pt reports diarrhea. Denies abdominal pain, bloating, constipation, diarrhea or blood in the stool.  GU: Denies urgency, frequency, pain with urination, burning sensation, blood in urine, odor or discharge. Musculoskeletal: Pt reports body aches. Denies decrease in range of motion, difficulty with gait, muscle pain or joint swelling.  Skin: Denies redness, rashes, lesions or ulcercations.  Neurological: Denies dizziness, difficulty with memory, difficulty with speech or problems with balance and coordination.    No other specific complaints in a complete review of systems (except as listed in HPI above).  Observations/Objective:   Wt Readings from Last 3 Encounters:   02/01/23 269 lb 3.2 oz (122.1 kg)  10/29/22 274 lb (124.3 kg)  01/08/22 265 lb (120.2 kg)    General: Appears her stated age, well developed, well nourished in NAD. HEENT: Head: normal shape and size; Nose: Slight congestion noted; Throat/Mouth: Slight hoarseness noted Pulmonary/Chest: Normal effort. No respiratory distress. .  Neurological: Alert and oriented.   BMET    Component Value Date/Time   NA 137 10/29/2022 1051   NA 137 11/06/2018 1448   K 4.4 10/29/2022 1051   CL 104 10/29/2022 1051   CO2 22 10/29/2022 1051   GLUCOSE 95 10/29/2022 1051   BUN 18 10/29/2022 1051   BUN 10 11/06/2018 1448   CREATININE 0.77 10/29/2022 1051   CALCIUM 9.4 10/29/2022 1051   GFRNONAA >60 03/02/2019 0740   GFRAA >60 03/02/2019 0740    Lipid Panel     Component Value Date/Time   CHOL 150 10/29/2022 1051   TRIG 258 (  H) 10/29/2022 1051   HDL 41 (L) 10/29/2022 1051   CHOLHDL 3.7 10/29/2022 1051   LDLCALC 75 10/29/2022 1051    CBC    Component Value Date/Time   WBC 9.1 10/29/2022 1051   RBC 4.59 10/29/2022 1051   HGB 13.8 10/29/2022 1051   HGB 11.8 11/06/2018 1448   HCT 42.4 10/29/2022 1051   HCT 34.8 11/06/2018 1448   PLT 322 10/29/2022 1051   PLT 248 11/06/2018 1448   MCV 92.4 10/29/2022 1051   MCV 86 11/06/2018 1448   MCH 30.1 10/29/2022 1051   MCHC 32.5 10/29/2022 1051   RDW 12.0 10/29/2022 1051   RDW 12.9 11/06/2018 1448   LYMPHSABS 2.0 05/08/2018 1559   EOSABS 0.1 05/08/2018 1559   BASOSABS 0.1 05/08/2018 1559    Hgb A1C Lab Results  Component Value Date   HGBA1C 5.4 10/29/2022       Assessment and Plan:  Assessment and Plan    Viral sinusitis Symptoms consistent with viral infection. No antibiotics indicated unless symptoms persist beyond 7-10 days. - Encouraged rest and fluids -Can use a Nettie pot which can be purchased from a local pharmacy - Prescribed Atrovent nasal spray for sinus congestion. - Recommended OTC Zyrtec or Claritin for postnasal  drip. - Advised rest, fluids, and Tylenol  for fever or body aches. - Instructed to report if symptoms do not improve by end of week.    Scheduled appointment for your annual exam Follow Up Instructions:    I discussed the assessment and treatment plan with the patient. The patient was provided an opportunity to ask questions and all were answered. The patient agreed with the plan and demonstrated an understanding of the instructions.   The patient was advised to call back or seek an in-person evaluation if the symptoms worsen or if the condition fails to improve as anticipated.   Angeline Laura, NP

## 2023-12-18 MED ORDER — AMOXICILLIN-POT CLAVULANATE 875-125 MG PO TABS: 1.0000 | ORAL_TABLET | Freq: Two times a day (BID) | ORAL | 0 refills | Status: AC

## 2024-01-08 ENCOUNTER — Other Ambulatory Visit: Payer: Self-pay | Admitting: Internal Medicine

## 2024-01-10 NOTE — Telephone Encounter (Signed)
 Requested medications are due for refill today.  unsure  Requested medications are on the active medications list.  yes  Last refill. 12/16/2023 15mL 0 rf  Future visit scheduled.   no  Notes to clinic.  Medication not assigned to a protocol. Please review for refill    Requested Prescriptions  Pending Prescriptions Disp Refills   ipratropium (ATROVENT ) 0.06 % nasal spray [Pharmacy Med Name: IPRATROPIUM 0.06% SPRAY]  1    Sig: Place 2 sprays into both nostrils 4 (four) times daily. For up to 5-7 days then stop.     Off-Protocol Failed - 01/10/2024  5:16 PM      Failed - Medication not assigned to a protocol, review manually.      Passed - Valid encounter within last 12 months    Recent Outpatient Visits           3 weeks ago Viral sinusitis   [facility] [place], Angeline ORN, NP   8 months ago Anxiety and depression   [place] [facility] [place], Angeline ORN, NP   10 months ago Acute non-recurrent frontal sinusitis   [facility] [place] Angeline ORN, NP             Off-Protocol Failed - 01/10/2024  5:16 PM      Failed - Medication not assigned to a protocol, review manually.      Passed - Valid encounter within last 12 months    Recent Outpatient Visits           3 weeks ago Viral sinusitis   [place] [facility] [place], Angeline ORN, NP   8 months ago Anxiety and depression   [place] [facility] [place], Angeline ORN, NP   10 months ago Acute non-recurrent frontal sinusitis    [facility] [place], Angeline ORN, [place]
